# Patient Record
Sex: Female | Born: 1955
Health system: Southern US, Community
[De-identification: ages and names within clinical notes are randomized; demographics above are authoritative.]

## PROBLEM LIST (undated history)

## (undated) DIAGNOSIS — G43909 Migraine, unspecified, not intractable, without status migrainosus: Secondary | ICD-10-CM

## (undated) DIAGNOSIS — F419 Anxiety disorder, unspecified: Secondary | ICD-10-CM

## (undated) DIAGNOSIS — I10 Essential (primary) hypertension: Secondary | ICD-10-CM

## (undated) DIAGNOSIS — J449 Chronic obstructive pulmonary disease, unspecified: Secondary | ICD-10-CM

## (undated) DIAGNOSIS — F329 Major depressive disorder, single episode, unspecified: Secondary | ICD-10-CM

## (undated) DIAGNOSIS — F32A Depression, unspecified: Secondary | ICD-10-CM

## (undated) HISTORY — DX: Anxiety disorder, unspecified: F41.9

## (undated) HISTORY — DX: Depression, unspecified: F32.A

## (undated) HISTORY — DX: Essential (primary) hypertension: I10

## (undated) HISTORY — DX: Major depressive disorder, single episode, unspecified: F32.9

## (undated) HISTORY — DX: Chronic obstructive pulmonary disease, unspecified: J44.9

## (undated) HISTORY — DX: Migraine, unspecified, not intractable, without status migrainosus: G43.909

---

## 2002-04-01 ENCOUNTER — Emergency Department (HOSPITAL_COMMUNITY): Admission: EM | Admit: 2002-04-01 | Discharge: 2002-04-01 | Payer: Self-pay | Admitting: Emergency Medicine

## 2002-04-01 ENCOUNTER — Encounter: Payer: Self-pay | Admitting: Emergency Medicine

## 2017-06-01 ENCOUNTER — Ambulatory Visit (INDEPENDENT_AMBULATORY_CARE_PROVIDER_SITE_OTHER): Payer: BLUE CROSS/BLUE SHIELD

## 2017-06-01 ENCOUNTER — Encounter (INDEPENDENT_AMBULATORY_CARE_PROVIDER_SITE_OTHER): Payer: Self-pay | Admitting: Orthopaedic Surgery

## 2017-06-01 ENCOUNTER — Ambulatory Visit (INDEPENDENT_AMBULATORY_CARE_PROVIDER_SITE_OTHER): Payer: BLUE CROSS/BLUE SHIELD | Admitting: Orthopaedic Surgery

## 2017-06-01 VITALS — BP 134/68 | HR 72 | Ht 61.0 in | Wt 190.0 lb

## 2017-06-01 DIAGNOSIS — M25532 Pain in left wrist: Secondary | ICD-10-CM

## 2017-06-01 DIAGNOSIS — M25531 Pain in right wrist: Secondary | ICD-10-CM | POA: Diagnosis not present

## 2017-06-01 NOTE — Addendum Note (Signed)
Addended byPrescott Parma: Teya Otterson on: 06/01/2017 11:41 AM   Modules accepted: Orders

## 2017-06-01 NOTE — Progress Notes (Signed)
Office Visit Note   Patient: Michaela Stafford           Date of Birth: [birth year]           MRN: [id number] Visit Date: 06/01/2017              Requested by: No referring provider defined for this encounter. PCP: No primary care provider on file.   Assessment & Plan: Visit Diagnoses:  1. Bilateral wrist pain     Plan: Nerve conduction velocity she is taken anti-inflammatories used night splints with some improvement but persistent symptoms.  Office follow-up after electrical test to rule out carpal tunnel syndrome worse on the right than left.  Follow-Up Instructions: No Follow-up on file.   Orders:  Orders Placed This Encounter  Procedures  . XR Wrist 2 Views Left  . XR Wrist 2 Views Right   No orders of the defined types were placed in this encounter.     Procedures: No procedures performed   Clinical Data: No additional findings.   Subjective: Chief Complaint  Patient presents with  . Right Wrist - Pain  . Left Wrist - Pain    HPI 62 year old female with several year history of bilateral hand pain worse in the right non dominant hand than left.  She has been using some wrist splints at night that helps some.  She drops objects.  She shakes her arms and it wakes her up at night she also has some discomfort in her neck.  She was referred here for possible carpal tunnel syndrome right greater than left.  Patient had a reaction with prednisone in the past as well as penicillin.  Review of Systems three-point review of system positive for anxiety diabetes emphysema hypertension migraines and sleep apnea.  This is borderline.  She does not smoke or drink.   Objective: Vital Signs: BP 134/68   Pulse 72   Ht 5\' 1"  (1.549 m)   Wt 190 lb (86.2 kg)   BMI 35.90 kg/m   Physical Exam  Constitutional: She is oriented to person, place, and time. She appears well-developed.  HENT:  Head: Normocephalic.  Right Ear: External ear normal.  Left Ear: External ear normal.    Eyes: Pupils are equal, round, and reactive to light.  Neck: No tracheal deviation present. No thyromegaly present.  Cardiovascular: Normal rate.  Pulmonary/Chest: Effort normal.  Abdominal: Soft.  Neurological: She is alert and oriented to person, place, and time.  Skin: Skin is warm and dry.  Psychiatric: She has a normal mood and affect. Her behavior is normal.    Ortho Exam patient has mild thenar atrophy on the right negative on the left.  Pain with carpal compression test.  Negative Phalen's test.  Some tenderness of the left fifth finger PIP joint.  No triggering.  Elbows reach full extension bilateral brachial plexus tenderness negative Spurling no increased pain with compression no change with distraction. Specialty Comments:  No specialty comments available.  Imaging: Xr Wrist 2 Views Left  Result Date: 06/01/2017 The x-rays left wrist obtained and reviewed.  This shows normal wrist joint.  Negative for acute changes mild IP degenerative changes. Impression left wrist negative for acute changes.  Xr Wrist 2 Views Right  Result Date: 06/01/2017 Three-view x-rays right wrist obtained.  This shows some degenerative changes IP joint of the thumb.  Negative for acute changes no significant radiocarpal arthritis. Impression normal wrist mild degenerative changes in the thumb IP joint.    PMFS  History: There are no active problems to display for this patient.  History reviewed. No pertinent past medical history.  History reviewed. No pertinent family history.  History reviewed. No pertinent surgical history. Social History   Occupational History  . Not on file  Tobacco Use  . Smoking status: Not on file  Substance and Sexual Activity  . Alcohol use: Not on file  . Drug use: Not on file  . Sexual activity: Not on file

## 2017-06-02 ENCOUNTER — Telehealth (INDEPENDENT_AMBULATORY_CARE_PROVIDER_SITE_OTHER): Payer: Self-pay | Admitting: Physical Medicine and Rehabilitation

## 2017-06-02 NOTE — Telephone Encounter (Signed)
Patient was seen in [place] yesterday, and patients husband called checking up on [facility]. Please call when possible to schedule # [phone]

## 2017-06-16 ENCOUNTER — Ambulatory Visit (INDEPENDENT_AMBULATORY_CARE_PROVIDER_SITE_OTHER): Payer: BLUE CROSS/BLUE SHIELD | Admitting: Physical Medicine and Rehabilitation

## 2017-06-16 ENCOUNTER — Encounter (INDEPENDENT_AMBULATORY_CARE_PROVIDER_SITE_OTHER): Payer: Self-pay | Admitting: Physical Medicine and Rehabilitation

## 2017-06-16 DIAGNOSIS — R202 Paresthesia of skin: Secondary | ICD-10-CM | POA: Diagnosis not present

## 2017-06-16 NOTE — Progress Notes (Deleted)
Pt states pain, numbness, and tingling in both right and left hand. Pt states symptoms has been bothering her for the past 3 years and has gotten worse as time went on. Pt states doing anything and holding anything heavy makes it hurt worse, pt states "hand gloves" eases pain. Left hand dominant.

## 2017-06-20 ENCOUNTER — Encounter (INDEPENDENT_AMBULATORY_CARE_PROVIDER_SITE_OTHER): Payer: Self-pay | Admitting: Physical Medicine and Rehabilitation

## 2017-06-20 NOTE — Procedures (Signed)
EMG & NCV Findings: Evaluation of the right median motor nerve showed prolonged distal onset latency (4.5 ms) and decreased conduction velocity (Elbow-Wrist, 48 m/s).  The left median (across palm) sensory nerve showed prolonged distal peak latency (Wrist, 4.0 ms) and prolonged distal peak latency (Palm, 2.7 ms).  The right median (across palm) sensory nerve showed prolonged distal peak latency (Wrist, 6.6 ms), reduced amplitude (5.2 V), and prolonged distal peak latency (Palm, 4.8 ms).  All remaining nerves (as indicated in the following tables) were within normal limits.  All left vs. right side differences were within normal limits.    All examined muscles (as indicated in the following table) showed no evidence of electrical instability.    Impression: The above electrodiagnostic study is ABNORMAL and reveals evidence of:  1.  A moderate to severe right median nerve entrapment at the wrist (carpal tunnel syndrome) affecting sensory and motor components.   2.  A mild left median nerve entrapment at the wrist (carpal tunnel syndrome) affecting sensory components.   There is no significant electrodiagnostic evidence of any other focal nerve entrapment, brachial plexopathy or generalized peripheral neuropathy.    Recommendations: 1.  Continue current management of symptoms. 2.  Continue use of resting splint at night-time and as needed during the day. 3.  Suggest surgical evaluation. 4.  Follow-up with referring physician.   Nerve Conduction Studies Anti Sensory Summary Table   Stim Site NR Peak (ms) Norm Peak (ms) P-T Amp (V) Norm P-T Amp Site1 Site2 Delta-P (ms) Dist (cm) Vel (m/s) Norm Vel (m/s)  Left Median Acr Palm Anti Sensory (2nd Digit)  32.8C  Wrist    *4.0 <3.6 15.7 >10 Wrist Palm 1.3 0.0    Palm    *2.7 <2.0 4.6         Right Median Acr Palm Anti Sensory (2nd Digit)  33.1C  Wrist    *6.6 <3.6 *5.2 >10 Wrist Palm 1.8 0.0    Palm    *4.8 <2.0 12.3         Left Radial Anti  Sensory (Base 1st Digit)  32.6C  Wrist    2.2 <3.1 18.7  Wrist Base 1st Digit 2.2 0.0    Right Radial Anti Sensory (Base 1st Digit)  33.2C  Wrist    2.1 <3.1 19.7  Wrist Base 1st Digit 2.1 0.0    Left Ulnar Anti Sensory (5th Digit)  32.8C  Wrist    3.2 <3.7 18.2 >15.0 Wrist 5th Digit 3.2 14.0 44 >38  Right Ulnar Anti Sensory (5th Digit)  33.3C  Wrist    3.0 <3.7 19.2 >15.0 Wrist 5th Digit 3.0 14.0 47 >38   Motor Summary Table   Stim Site NR Onset (ms) Norm Onset (ms) O-P Amp (mV) Norm O-P Amp Site1 Site2 Delta-0 (ms) Dist (cm) Vel (m/s) Norm Vel (m/s)  Left Median Motor (Abd Poll Brev)  32.7C  Wrist    3.9 <4.2 7.1 >5 Elbow Wrist 3.7 18.5 50 >50  Elbow    7.6  7.4         Right Median Motor (Abd Poll Brev)  33.1C  Wrist    *4.5 <4.2 8.6 >5 Elbow Wrist 3.6 17.2 *48 >50  Elbow    8.1  8.3         Left Ulnar Motor (Abd Dig Min)  32.5C  Wrist    2.5 <4.2 6.8 >3 B Elbow Wrist 2.8 17.0 61 >53  B Elbow    5.3  6.8  A Elbow B Elbow 1.2 10.0 83 >53  A Elbow    6.5  6.7         Right Ulnar Motor (Abd Dig Min)  33.1C  Wrist    2.7 <4.2 7.2 >3 B Elbow Wrist 2.6 16.5 63 >53  B Elbow    5.3  7.8  A Elbow B Elbow 1.0 9.0 90 >53  A Elbow    6.3  7.6          EMG   Side Muscle Nerve Root Ins Act Fibs Psw Amp Dur Poly Recrt Int Dennie Bible Comment  Right Abd Poll Brev Median C8-T1 Nml Nml Nml Nml Nml 0 Nml Nml   Right 1stDorInt Ulnar C8-T1 Nml Nml Nml Nml Nml 0 Nml Nml     Nerve Conduction Studies Anti Sensory Left/Right Comparison   Stim Site L Lat (ms) R Lat (ms) L-R Lat (ms) L Amp (V) R Amp (V) L-R Amp (%) Site1 Site2 L Vel (m/s) R Vel (m/s) L-R Vel (m/s)  Median Acr Palm Anti Sensory (2nd Digit)  32.8C  Wrist *4.0 *6.6 2.6 15.7 *5.2 66.9 Wrist Palm     Palm *2.7 *4.8 2.1 4.6 12.3 62.6       Radial Anti Sensory (Base 1st Digit)  32.6C  Wrist 2.2 2.1 0.1 18.7 19.7 5.1 Wrist Base 1st Digit     Ulnar Anti Sensory (5th Digit)  32.8C  Wrist 3.2 3.0 0.2 18.2 19.2 5.2 Wrist 5th Digit 44 47  3   Motor Left/Right Comparison   Stim Site L Lat (ms) R Lat (ms) L-R Lat (ms) L Amp (mV) R Amp (mV) L-R Amp (%) Site1 Site2 L Vel (m/s) R Vel (m/s) L-R Vel (m/s)  Median Motor (Abd Poll Brev)  32.7C  Wrist 3.9 *4.5 0.6 7.1 8.6 17.4 Elbow Wrist 50 *48 2  Elbow 7.6 8.1 0.5 7.4 8.3 10.8       Ulnar Motor (Abd Dig Min)  32.5C  Wrist 2.5 2.7 0.2 6.8 7.2 5.6 B Elbow Wrist 61 63 2  B Elbow 5.3 5.3 0.0 6.8 7.8 12.8 A Elbow B Elbow 83 90 7  A Elbow 6.5 6.3 0.2 6.7 7.6 11.8          Waveforms:

## 2017-06-20 NOTE — Progress Notes (Signed)
Michaela Stafford - 62 y.o. female MRN [id number]  Date of birth: 06/19/1955  Office Visit Note: Visit Date: 06/16/2017 PCP: System, Pcp Not In Referred by: No ref. provider found  Subjective: Chief Complaint  Patient presents with  . Right Hand - Pain, Numbness, Tingling  . Left Hand - Pain, Numbness, Tingling   HPI: Michaela Stafford is a 62 year old left-hand-dominant female with pain numbness and tingling in both the right and left hands right more than left.  She reports symptoms over the last 3 years but really worsening as time has progressed.  She reports that doing anything and holding anything heavy will make the pain in the wrist and hands hurt worse and that she will get more numbness.  She does report that using "hand gloves "eases the pain.  She also reports some nocturnal complaints of increasing hand numbness.  She denies any frank radicular symptoms.  She has not had prior electrodiagnostic studies.  She has been using anti-inflammatories.    ROS Otherwise per HPI.  Assessment & Plan: Visit Diagnoses:  1. Paresthesia of skin     Plan: No additional findings.   Meds & Orders: No orders of the defined types were placed in this encounter.   Orders Placed This Encounter  Procedures  . NCV with EMG (electromyography)    Follow-up: Return for Dr. Ophelia Charter.  Impression: The above electrodiagnostic study is ABNORMAL and reveals evidence of:  1.  A moderate to severe right median nerve entrapment at the wrist (carpal tunnel syndrome) affecting sensory and motor components.   2.  A mild left median nerve entrapment at the wrist (carpal tunnel syndrome) affecting sensory components.   There is no significant electrodiagnostic evidence of any other focal nerve entrapment, brachial plexopathy or generalized peripheral neuropathy.    Recommendations: 1.  Continue current management of symptoms. 2.  Continue use of resting splint at night-time and as needed during the day. 3.  Suggest  surgical evaluation. 4.  Follow-up with referring physician.    Procedures: No procedures performed  EMG & NCV Findings: Evaluation of the right median motor nerve showed prolonged distal onset latency (4.5 ms) and decreased conduction velocity (Elbow-Wrist, 48 m/s).  The left median (across palm) sensory nerve showed prolonged distal peak latency (Wrist, 4.0 ms) and prolonged distal peak latency (Palm, 2.7 ms).  The right median (across palm) sensory nerve showed prolonged distal peak latency (Wrist, 6.6 ms), reduced amplitude (5.2 V), and prolonged distal peak latency (Palm, 4.8 ms).  All remaining nerves (as indicated in the following tables) were within normal limits.  All left vs. right side differences were within normal limits.    All examined muscles (as indicated in the following table) showed no evidence of electrical instability.    Impression: The above electrodiagnostic study is ABNORMAL and reveals evidence of:  1.  A moderate to severe right median nerve entrapment at the wrist (carpal tunnel syndrome) affecting sensory and motor components.   2.  A mild left median nerve entrapment at the wrist (carpal tunnel syndrome) affecting sensory components.   There is no significant electrodiagnostic evidence of any other focal nerve entrapment, brachial plexopathy or generalized peripheral neuropathy.    Recommendations: 1.  Continue current management of symptoms. 2.  Continue use of resting splint at night-time and as needed during the day. 3.  Suggest surgical evaluation. 4.  Follow-up with referring physician.   Nerve Conduction Studies Anti Sensory Summary Table   Stim Site NR Peak (ms)  Norm Peak (ms) P-T Amp (V) Norm P-T Amp Site1 Site2 Delta-P (ms) Dist (cm) Vel (m/s) Norm Vel (m/s)  Left Median Acr Palm Anti Sensory (2nd Digit)  32.8C  Wrist    *4.0 <3.6 15.7 >10 Wrist Palm 1.3 0.0    Palm    *2.7 <2.0 4.6         Right Median Acr Palm Anti Sensory (2nd Digit)   33.1C  Wrist    *6.6 <3.6 *5.2 >10 Wrist Palm 1.8 0.0    Palm    *4.8 <2.0 12.3         Left Radial Anti Sensory (Base 1st Digit)  32.6C  Wrist    2.2 <3.1 18.7  Wrist Base 1st Digit 2.2 0.0    Right Radial Anti Sensory (Base 1st Digit)  33.2C  Wrist    2.1 <3.1 19.7  Wrist Base 1st Digit 2.1 0.0    Left Ulnar Anti Sensory (5th Digit)  32.8C  Wrist    3.2 <3.7 18.2 >15.0 Wrist 5th Digit 3.2 14.0 44 >38  Right Ulnar Anti Sensory (5th Digit)  33.3C  Wrist    3.0 <3.7 19.2 >15.0 Wrist 5th Digit 3.0 14.0 47 >38   Motor Summary Table   Stim Site NR Onset (ms) Norm Onset (ms) O-P Amp (mV) Norm O-P Amp Site1 Site2 Delta-0 (ms) Dist (cm) Vel (m/s) Norm Vel (m/s)  Left Median Motor (Abd Poll Brev)  32.7C  Wrist    3.9 <4.2 7.1 >5 Elbow Wrist 3.7 18.5 50 >50  Elbow    7.6  7.4         Right Median Motor (Abd Poll Brev)  33.1C  Wrist    *4.5 <4.2 8.6 >5 Elbow Wrist 3.6 17.2 *48 >50  Elbow    8.1  8.3         Left Ulnar Motor (Abd Dig Min)  32.5C  Wrist    2.5 <4.2 6.8 >3 B Elbow Wrist 2.8 17.0 61 >53  B Elbow    5.3  6.8  A Elbow B Elbow 1.2 10.0 83 >53  A Elbow    6.5  6.7         Right Ulnar Motor (Abd Dig Min)  33.1C  Wrist    2.7 <4.2 7.2 >3 B Elbow Wrist 2.6 16.5 63 >53  B Elbow    5.3  7.8  A Elbow B Elbow 1.0 9.0 90 >53  A Elbow    6.3  7.6          EMG   Side Muscle Nerve Root Ins Act Fibs Psw Amp Dur Poly Recrt Int Dennie BiblePat Comment  Right Abd Poll Brev Median C8-T1 Nml Nml Nml Nml Nml 0 Nml Nml   Right 1stDorInt Ulnar C8-T1 Nml Nml Nml Nml Nml 0 Nml Nml     Nerve Conduction Studies Anti Sensory Left/Right Comparison   Stim Site L Lat (ms) R Lat (ms) L-R Lat (ms) L Amp (V) R Amp (V) L-R Amp (%) Site1 Site2 L Vel (m/s) R Vel (m/s) L-R Vel (m/s)  Median Acr Palm Anti Sensory (2nd Digit)  32.8C  Wrist *4.0 *6.6 2.6 15.7 *5.2 66.9 Wrist Palm     Palm *2.7 *4.8 2.1 4.6 12.3 62.6       Radial Anti Sensory (Base 1st Digit)  32.6C  Wrist 2.2 2.1 0.1 18.7 19.7 5.1 Wrist Base  1st Digit     Ulnar Anti Sensory (5th Digit)  32.8C  Wrist 3.2  3.0 0.2 18.2 19.2 5.2 Wrist 5th Digit 44 47 3   Motor Left/Right Comparison   Stim Site L Lat (ms) R Lat (ms) L-R Lat (ms) L Amp (mV) R Amp (mV) L-R Amp (%) Site1 Site2 L Vel (m/s) R Vel (m/s) L-R Vel (m/s)  Median Motor (Abd Poll Brev)  32.7C  Wrist 3.9 *4.5 0.6 7.1 8.6 17.4 Elbow Wrist 50 *48 2  Elbow 7.6 8.1 0.5 7.4 8.3 10.8       Ulnar Motor (Abd Dig Min)  32.5C  Wrist 2.5 2.7 0.2 6.8 7.2 5.6 B Elbow Wrist 61 63 2  B Elbow 5.3 5.3 0.0 6.8 7.8 12.8 A Elbow B Elbow 83 90 7  A Elbow 6.5 6.3 0.2 6.7 7.6 11.8          Waveforms:                     Clinical History: No specialty comments available.  She reports that she has quit smoking. she has never used smokeless tobacco. No results for input(s): HGBA1C, LABURIC in the last 8760 hours.  Objective:  VS:  HT:    WT:   BMI:     BP:   HR: bpm  TEMP: ( )  RESP:  Physical Exam  Musculoskeletal:  Inspection reveals some flattening of the right APB but no atrophy of the bilateral APB or FDI or hand intrinsics. There is no swelling, color changes, allodynia or dystrophic changes. There is 5 out of 5 strength in the bilateral wrist extension, finger abduction and long finger flexion.  There is decreased sensation to light touch in the right median nerve distribution compared to left.   There is a negative Hoffmann's test bilaterally.    Ortho Exam Imaging: No results found.  Past Medical/Family/Surgical/Social History: Medications & Allergies reviewed per EMR There are no active problems to display for this patient.  Past Medical History:  Diagnosis Date  . Anxiety   . COPD (chronic obstructive pulmonary disease) (HCC)   . Depression   . Hypertension   . Migraine    History reviewed. No pertinent family history. History reviewed. No pertinent surgical history. Social History   Occupational History  . Not on file  Tobacco Use  . Smoking status:  Former Games developer  . Smokeless tobacco: Never Used  Substance and Sexual Activity  . Alcohol use: Not on file  . Drug use: Not on file  . Sexual activity: Not on file

## 2017-06-22 ENCOUNTER — Encounter (INDEPENDENT_AMBULATORY_CARE_PROVIDER_SITE_OTHER): Payer: Self-pay | Admitting: Orthopaedic Surgery

## 2017-06-22 ENCOUNTER — Ambulatory Visit (INDEPENDENT_AMBULATORY_CARE_PROVIDER_SITE_OTHER): Payer: BLUE CROSS/BLUE SHIELD | Admitting: Orthopaedic Surgery

## 2017-06-22 VITALS — BP 129/75 | HR 81 | Ht 61.5 in | Wt 180.0 lb

## 2017-06-22 DIAGNOSIS — G5603 Carpal tunnel syndrome, bilateral upper limbs: Secondary | ICD-10-CM

## 2017-06-22 NOTE — Progress Notes (Signed)
Office Visit Note   Patient: Michaela Stafford           Date of Birth: Oct 03, 1955           MRN: [id number] Visit Date: 06/22/2017              Requested by: No referring provider defined for this encounter. PCP: System, Pcp Not In   Assessment & Plan: Visit Diagnoses:  1. Bilateral carpal tunnel syndrome     Plan: Patient has moderate to severe carpal tunnel syndrome on the right , mild on the  left.  He would like to proceed with right carpal tunnel release as an outpatient.  Procedure discussed technique discussed.  Questions were elicited and answered she would like to proceed. Follow-Up Instructions: Preop for right carpal tunnel release outpatient surgery with IV sedation MAC  and local anesthesia.  Orders:  No orders of the defined types were placed in this encounter.  No orders of the defined types were placed in this encounter.     Procedures: No procedures performed   Clinical Data: No additional findings.   Subjective: Chief Complaint  Patient presents with  . Right Wrist - Pain, Follow-up    EMG/NCS review  . Left Wrist - Pain, Follow-up    EMG/NCS review    HPI patient returns for bilateral carpal tunnel syndrome is been using a splint.  Had symptoms for greater than 3 years more problems with the right hand and left hand.  Day drops objects.  She is taken anti-inflammatories without relief.  Right carpal tunnel was moderate to severe by nerve conduction velocities and EMGs left was rated at mild.  She has bilateral hand symptoms but much worse symptoms on the right than left.  Review of Systems 14 point review of systems updated and positive for anxiety, diabetes, emphysema, hypertension migraines, sleep apnea.  Sleep apnea was rated at borderline.  Patient does not smoke and does not drink.   Objective: Vital Signs: BP 129/75   Pulse 81   Ht 5' 1.5" (1.562 m)   Wt 180 lb (81.6 kg)   BMI 33.46 kg/m   Physical Exam  Constitutional: She is oriented to  person, place, and time. She appears well-developed.  HENT:  Head: Normocephalic.  Right Ear: External ear normal.  Left Ear: External ear normal.  Eyes: Pupils are equal, round, and reactive to light.  Neck: No tracheal deviation present. No thyromegaly present.  Cardiovascular: Normal rate.  Pulmonary/Chest: Effort normal.  Abdominal: Soft.  Neurological: She is alert and oriented to person, place, and time.  Skin: Skin is warm and dry.  Psychiatric: She has a normal mood and affect. Her behavior is normal.    Ortho Exam Tinel's right greater than left at the carpal canal.  Mild thenar atrophy on the right normal on the left.  Her weakness on the right.  No brachial plexus tenderness.  Negative Spurling right and left.  Lower extremity reflexes are 2+.  Station to the ulnar digits are normal.  Cubital tunnel is normal right and left.  Specialty Comments:  No specialty comments available.  Imaging: No results found.   PMFS History: There are no active problems to display for this patient.  Past Medical History:  Diagnosis Date  . Anxiety   . COPD (chronic obstructive pulmonary disease) ([place])   . Depression   . Hypertension   . Migraine     No family history on file.  No past surgical history on  file. Social History   Occupational History  . Not on file  Tobacco Use  . Smoking status: Former Research scientist (life sciences)  . Smokeless tobacco: Never Used  Substance and Sexual Activity  . Alcohol use: Not on file  . Drug use: Not on file  . Sexual activity: Not on file

## 2017-07-03 DIAGNOSIS — G5601 Carpal tunnel syndrome, right upper limb: Secondary | ICD-10-CM | POA: Diagnosis not present

## 2017-07-12 ENCOUNTER — Encounter (INDEPENDENT_AMBULATORY_CARE_PROVIDER_SITE_OTHER): Payer: Self-pay | Admitting: Surgery

## 2017-07-12 ENCOUNTER — Ambulatory Visit (INDEPENDENT_AMBULATORY_CARE_PROVIDER_SITE_OTHER): Payer: BLUE CROSS/BLUE SHIELD | Admitting: Surgery

## 2017-07-12 VITALS — Ht 61.5 in | Wt 180.0 lb

## 2017-07-12 DIAGNOSIS — Z9889 Other specified postprocedural states: Secondary | ICD-10-CM

## 2017-07-12 NOTE — Progress Notes (Signed)
   Office Visit Note   Patient: Michaela Stafford           Date of Birth: 1956-02-08           MRN: [id number] Visit Date: 07/12/2017              Requested by: No referring provider defined for this encounter. PCP: System, Pcp Not In   Assessment & Plan: Visit Diagnoses:  1. Status post carpal tunnel release     Plan: Patient will follow up with Dr. Ophelia CharterYates as scheduled next week for wound check and possible suture removal in [place].  She is given a removable wrist splint to wear.  Can work gentle range of motion of her wrist and fingers.  No lifting pushing pulling with right hand.  Follow-Up Instructions: Return for Next week with Dr. Ophelia CharterYates in [place] as scheduled.   Orders:  No orders of the defined types were placed in this encounter.  No orders of the defined types were placed in this encounter.     Procedures: No procedures performed   Clinical Data: No additional findings.   Subjective: Chief Complaint  Patient presents with  . Right Wrist - Routine Post Op    HPI Patient a one-week status post right carpal tunnel release returns.  States that she is doing well.  Not having much pain.  Weaning off pain medication. Review of Systems No cardiac pulmonary GI GU issues  Objective: Vital Signs: Ht 5' 1.5" (1.562 m)   Wt 180 lb (81.6 kg)   BMI 33.46 kg/m   Physical Exam  Constitutional: She is oriented to person, place, and time. She appears well-developed. No distress.  HENT:  Head: Normocephalic.  Eyes: EOM are normal. Pupils are equal, round, and reactive to light.  Pulmonary/Chest: No respiratory distress.  Musculoskeletal:  Wound looks good.  Sutures are intact.  No drainage or signs of infection.  No much by the way of swelling.  Neurological: She is oriented to person, place, and time.    Ortho Exam  Specialty Comments:  No specialty comments available.  Imaging: No results found.   PMFS History: There are no active problems to display for this  patient.  Past Medical History:  Diagnosis Date  . Anxiety   . COPD (chronic obstructive pulmonary disease) (HCC)   . Depression   . Hypertension   . Migraine     No family history on file.  No past surgical history on file. Social History   Occupational History  . Not on file  Tobacco Use  . Smoking status: Former Games developermoker  . Smokeless tobacco: Never Used  Substance and Sexual Activity  . Alcohol use: Not on file  . Drug use: Not on file  . Sexual activity: Not on file

## 2017-07-20 ENCOUNTER — Ambulatory Visit (INDEPENDENT_AMBULATORY_CARE_PROVIDER_SITE_OTHER): Payer: BLUE CROSS/BLUE SHIELD | Admitting: Orthopaedic Surgery

## 2017-07-20 ENCOUNTER — Encounter (INDEPENDENT_AMBULATORY_CARE_PROVIDER_SITE_OTHER): Payer: Self-pay | Admitting: Orthopaedic Surgery

## 2017-07-20 VITALS — BP 134/72 | HR 66

## 2017-07-20 DIAGNOSIS — G5602 Carpal tunnel syndrome, left upper limb: Secondary | ICD-10-CM

## 2017-07-20 NOTE — Progress Notes (Signed)
   Post-Op Visit Note   Patient: Michaela Stafford           Date of Birth: [birth year]           MRN: [id number] Visit Date: 07/20/2017 PCP: System, Pcp Not In   Assessment & Plan: Post right carpal tunnel release sutures are harvested incision looks good opposite hand gives her some problems she will call if she becomes more symptomatic.  She is happy with the surgical result.  Chief Complaint:  Chief Complaint  Patient presents with  . Right Wrist - Routine Post Op   Visit Diagnoses:  1. Carpal tunnel syndrome, left upper limb     Plan: Sutures out return PRN.  Follow-Up Instructions: Return if symptoms worsen or fail to improve.   Orders:  No orders of the defined types were placed in this encounter.  No orders of the defined types were placed in this encounter.   Imaging: No results found.  PMFS History: There are no active problems to display for this patient.  Past Medical History:  Diagnosis Date  . Anxiety   . COPD (chronic obstructive pulmonary disease) (HCC)   . Depression   . Hypertension   . Migraine     No family history on file.  No past surgical history on file. Social History   Occupational History  . Not on file  Tobacco Use  . Smoking status: Former Games developermoker  . Smokeless tobacco: Never Used  Substance and Sexual Activity  . Alcohol use: Not on file  . Drug use: Not on file  . Sexual activity: Not on file

## 2017-07-21 ENCOUNTER — Ambulatory Visit (INDEPENDENT_AMBULATORY_CARE_PROVIDER_SITE_OTHER): Payer: BLUE CROSS/BLUE SHIELD | Admitting: Orthopaedic Surgery

## 2017-08-10 ENCOUNTER — Ambulatory Visit (INDEPENDENT_AMBULATORY_CARE_PROVIDER_SITE_OTHER): Payer: BLUE CROSS/BLUE SHIELD | Admitting: Orthopaedic Surgery

## 2017-08-10 ENCOUNTER — Encounter (INDEPENDENT_AMBULATORY_CARE_PROVIDER_SITE_OTHER): Payer: Self-pay | Admitting: Orthopaedic Surgery

## 2017-08-10 VITALS — BP 127/76 | HR 71

## 2017-08-10 DIAGNOSIS — M79641 Pain in right hand: Secondary | ICD-10-CM

## 2017-08-10 NOTE — Progress Notes (Signed)
   Post-Op Visit Note   Patient: Michaela Stafford           Date of Birth: 1955/12/08           MRN: [id number] Visit Date: 08/10/2017 PCP: System, Pcp Not In   Assessment & Plan: Post right carpal tunnel release she has severe carpal tunnel syndrome.  She has some itching at the incision some thickening in the subcutaneous tissue.  She is using some Benadryl trick cream and uses her splint at night.  Opposite hand had less severe disease.  WILL TRY some gabapentin 100 mg at night.  Chief Complaint:  Chief Complaint  Patient presents with  . Right Wrist - Routine Post Op   Visit Diagnoses:  1. Pain in right hand     Plan: Carpal tunnel release with itching at the incision and tenderness.  We will try some Neurontin at night her carpal tunnel was severe.  Recheck 5 weeks.  Opposite hand showed moderate disease.  Follow-Up Instructions: Return in about 5 weeks (around 09/14/2017).   Orders:  No orders of the defined types were placed in this encounter.  No orders of the defined types were placed in this encounter.   Imaging: No results found.  PMFS History: There are no active problems to display for this patient.  Past Medical History:  Diagnosis Date  . Anxiety   . COPD (chronic obstructive pulmonary disease) (HCC)   . Depression   . Hypertension   . Migraine     No family history on file.  No past surgical history on file. Social History   Occupational History  . Not on file  Tobacco Use  . Smoking status: Former Games developermoker  . Smokeless tobacco: Never Used  Substance and Sexual Activity  . Alcohol use: Not on file  . Drug use: Not on file  . Sexual activity: Not on file

## 2017-09-05 ENCOUNTER — Telehealth (INDEPENDENT_AMBULATORY_CARE_PROVIDER_SITE_OTHER): Payer: Self-pay | Admitting: Radiology

## 2017-09-05 MED ORDER — GABAPENTIN 100 MG PO CAPS: 100.0000 mg | ORAL_CAPSULE | Freq: Every day | ORAL | 1 refills | Status: AC

## 2017-09-05 NOTE — Telephone Encounter (Signed)
Sent to pharmacy 

## 2017-09-05 NOTE — Telephone Encounter (Signed)
Ok for #  30 tablets and one refill. Not sure she will need it for 3 months thanks

## 2017-09-05 NOTE — Telephone Encounter (Signed)
Received request for refill Gabapentin 100mg  capsule- Take one capsule by mouth at bedtime #90 from [facility] in [place].  OK to refill?

## 2017-09-14 ENCOUNTER — Ambulatory Visit (INDEPENDENT_AMBULATORY_CARE_PROVIDER_SITE_OTHER): Payer: BLUE CROSS/BLUE SHIELD | Admitting: Orthopaedic Surgery

## 2017-09-21 ENCOUNTER — Ambulatory Visit (INDEPENDENT_AMBULATORY_CARE_PROVIDER_SITE_OTHER): Payer: BLUE CROSS/BLUE SHIELD | Admitting: Orthopaedic Surgery

## 2018-10-02 DIAGNOSIS — I10 Essential (primary) hypertension: Secondary | ICD-10-CM | POA: Diagnosis not present

## 2018-10-02 DIAGNOSIS — E7800 Pure hypercholesterolemia, unspecified: Secondary | ICD-10-CM | POA: Diagnosis not present

## 2018-10-02 DIAGNOSIS — E782 Mixed hyperlipidemia: Secondary | ICD-10-CM | POA: Diagnosis not present

## 2018-10-02 DIAGNOSIS — E119 Type 2 diabetes mellitus without complications: Secondary | ICD-10-CM | POA: Diagnosis not present

## 2018-10-02 DIAGNOSIS — J441 Chronic obstructive pulmonary disease with (acute) exacerbation: Secondary | ICD-10-CM | POA: Diagnosis not present

## 2018-10-10 DIAGNOSIS — J438 Other emphysema: Secondary | ICD-10-CM | POA: Diagnosis not present

## 2018-10-10 DIAGNOSIS — E6609 Other obesity due to excess calories: Secondary | ICD-10-CM | POA: Diagnosis not present

## 2018-10-10 DIAGNOSIS — G4733 Obstructive sleep apnea (adult) (pediatric): Secondary | ICD-10-CM | POA: Diagnosis not present

## 2018-10-10 DIAGNOSIS — R69 Illness, unspecified: Secondary | ICD-10-CM | POA: Diagnosis not present

## 2018-10-10 DIAGNOSIS — E782 Mixed hyperlipidemia: Secondary | ICD-10-CM | POA: Diagnosis not present

## 2018-10-10 DIAGNOSIS — E119 Type 2 diabetes mellitus without complications: Secondary | ICD-10-CM | POA: Diagnosis not present

## 2018-10-10 DIAGNOSIS — Z6834 Body mass index (BMI) 34.0-34.9, adult: Secondary | ICD-10-CM | POA: Diagnosis not present

## 2018-10-10 DIAGNOSIS — I10 Essential (primary) hypertension: Secondary | ICD-10-CM | POA: Diagnosis not present

## 2019-01-17 DIAGNOSIS — H5203 Hypermetropia, bilateral: Secondary | ICD-10-CM | POA: Diagnosis not present

## 2019-01-17 DIAGNOSIS — Z0100 Encounter for examination of eyes and vision without abnormal findings: Secondary | ICD-10-CM | POA: Diagnosis not present

## 2019-02-05 DIAGNOSIS — G4733 Obstructive sleep apnea (adult) (pediatric): Secondary | ICD-10-CM | POA: Diagnosis not present

## 2019-02-05 DIAGNOSIS — E6609 Other obesity due to excess calories: Secondary | ICD-10-CM | POA: Diagnosis not present

## 2019-02-05 DIAGNOSIS — E119 Type 2 diabetes mellitus without complications: Secondary | ICD-10-CM | POA: Diagnosis not present

## 2019-02-05 DIAGNOSIS — Z6835 Body mass index (BMI) 35.0-35.9, adult: Secondary | ICD-10-CM | POA: Diagnosis not present

## 2019-02-05 DIAGNOSIS — R69 Illness, unspecified: Secondary | ICD-10-CM | POA: Diagnosis not present

## 2019-02-05 DIAGNOSIS — I10 Essential (primary) hypertension: Secondary | ICD-10-CM | POA: Diagnosis not present

## 2019-02-05 DIAGNOSIS — E782 Mixed hyperlipidemia: Secondary | ICD-10-CM | POA: Diagnosis not present

## 2019-02-05 DIAGNOSIS — J438 Other emphysema: Secondary | ICD-10-CM | POA: Diagnosis not present

## 2019-02-11 DIAGNOSIS — Z23 Encounter for immunization: Secondary | ICD-10-CM | POA: Diagnosis not present

## 2019-02-27 DIAGNOSIS — I10 Essential (primary) hypertension: Secondary | ICD-10-CM | POA: Diagnosis not present

## 2019-02-27 DIAGNOSIS — E119 Type 2 diabetes mellitus without complications: Secondary | ICD-10-CM | POA: Diagnosis not present

## 2019-02-27 DIAGNOSIS — E782 Mixed hyperlipidemia: Secondary | ICD-10-CM | POA: Diagnosis not present

## 2019-03-29 DIAGNOSIS — E782 Mixed hyperlipidemia: Secondary | ICD-10-CM | POA: Diagnosis not present

## 2019-03-29 DIAGNOSIS — I10 Essential (primary) hypertension: Secondary | ICD-10-CM | POA: Diagnosis not present

## 2019-04-29 DIAGNOSIS — I10 Essential (primary) hypertension: Secondary | ICD-10-CM | POA: Diagnosis not present

## 2019-04-29 DIAGNOSIS — E782 Mixed hyperlipidemia: Secondary | ICD-10-CM | POA: Diagnosis not present

## 2019-04-29 DIAGNOSIS — E119 Type 2 diabetes mellitus without complications: Secondary | ICD-10-CM | POA: Diagnosis not present

## 2019-05-28 DIAGNOSIS — E782 Mixed hyperlipidemia: Secondary | ICD-10-CM | POA: Diagnosis not present

## 2019-05-28 DIAGNOSIS — E1142 Type 2 diabetes mellitus with diabetic polyneuropathy: Secondary | ICD-10-CM | POA: Diagnosis not present

## 2019-05-28 DIAGNOSIS — E876 Hypokalemia: Secondary | ICD-10-CM | POA: Diagnosis not present

## 2019-05-30 DIAGNOSIS — E782 Mixed hyperlipidemia: Secondary | ICD-10-CM | POA: Diagnosis not present

## 2019-05-30 DIAGNOSIS — I10 Essential (primary) hypertension: Secondary | ICD-10-CM | POA: Diagnosis not present

## 2019-06-03 DIAGNOSIS — M545 Low back pain: Secondary | ICD-10-CM | POA: Diagnosis not present

## 2019-06-03 DIAGNOSIS — Z6835 Body mass index (BMI) 35.0-35.9, adult: Secondary | ICD-10-CM | POA: Diagnosis not present

## 2019-06-06 DIAGNOSIS — Z23 Encounter for immunization: Secondary | ICD-10-CM | POA: Diagnosis not present

## 2019-06-06 DIAGNOSIS — E119 Type 2 diabetes mellitus without complications: Secondary | ICD-10-CM | POA: Diagnosis not present

## 2019-06-06 DIAGNOSIS — G4733 Obstructive sleep apnea (adult) (pediatric): Secondary | ICD-10-CM | POA: Diagnosis not present

## 2019-06-06 DIAGNOSIS — F411 Generalized anxiety disorder: Secondary | ICD-10-CM | POA: Diagnosis not present

## 2019-06-06 DIAGNOSIS — E782 Mixed hyperlipidemia: Secondary | ICD-10-CM | POA: Diagnosis not present

## 2019-06-06 DIAGNOSIS — I10 Essential (primary) hypertension: Secondary | ICD-10-CM | POA: Diagnosis not present

## 2019-06-06 DIAGNOSIS — Z6835 Body mass index (BMI) 35.0-35.9, adult: Secondary | ICD-10-CM | POA: Diagnosis not present

## 2019-06-06 DIAGNOSIS — J438 Other emphysema: Secondary | ICD-10-CM | POA: Diagnosis not present

## 2019-07-26 DIAGNOSIS — E7849 Other hyperlipidemia: Secondary | ICD-10-CM | POA: Diagnosis not present

## 2019-07-26 DIAGNOSIS — I10 Essential (primary) hypertension: Secondary | ICD-10-CM | POA: Diagnosis not present

## 2019-08-01 ENCOUNTER — Ambulatory Visit: Payer: Self-pay | Attending: Internal Medicine

## 2019-08-01 DIAGNOSIS — Z23 Encounter for immunization: Secondary | ICD-10-CM | POA: Insufficient documentation

## 2019-08-01 NOTE — Progress Notes (Signed)
   Covid-19 Vaccination Clinic  Name:  Michaela Stafford    MRN: [id number] DOB: July 15, 1955  08/01/2019  Ms. Michaela Stafford was observed post Covid-19 immunization for 15 minutes without incident. She was provided with Vaccine Information Sheet and instruction to access the V-Safe system.   Ms. Michaela Stafford was instructed to call 911 with any severe reactions post vaccine: Marland Kitchen Difficulty breathing  . Swelling of face and throat  . A fast heartbeat  . A bad rash all over body  . Dizziness and weakness   Immunizations Administered    Name Date Dose VIS Date Route   Moderna COVID-19 Vaccine 08/01/2019 10:50 AM 0.5 mL 04/30/2019 Intramuscular   Manufacturer: Moderna   Lot: [id number]   NDC: 33825-053-97

## 2019-08-28 DIAGNOSIS — I10 Essential (primary) hypertension: Secondary | ICD-10-CM | POA: Diagnosis not present

## 2019-08-28 DIAGNOSIS — E7849 Other hyperlipidemia: Secondary | ICD-10-CM | POA: Diagnosis not present

## 2019-09-03 ENCOUNTER — Ambulatory Visit: Payer: Self-pay | Attending: Internal Medicine

## 2019-09-03 DIAGNOSIS — Z23 Encounter for immunization: Secondary | ICD-10-CM

## 2019-09-03 NOTE — Progress Notes (Signed)
   Covid-19 Vaccination Clinic  Name:  Michaela Stafford    MRN: [id number] DOB: [birth year]  09/03/2019  [name] was observed post Covid-19 immunization for 15 minutes without incident. She was provided with Vaccine Information Sheet and instruction to access the V-Safe system.   [name] was instructed to call 911 with any severe reactions post vaccine: Marland Kitchen Difficulty breathing  . Swelling of face and throat  . A fast heartbeat  . A bad rash all over body  . Dizziness and weakness   Immunizations Administered    Name Date Dose VIS Date Route   Moderna COVID-19 Vaccine 09/03/2019  8:57 AM 0.5 mL 04/30/2019 Intramuscular   Manufacturer: Moderna   Lot: [id number]   NDC: 07218-288-33

## 2019-09-27 DIAGNOSIS — I10 Essential (primary) hypertension: Secondary | ICD-10-CM | POA: Diagnosis not present

## 2019-09-27 DIAGNOSIS — E7849 Other hyperlipidemia: Secondary | ICD-10-CM | POA: Diagnosis not present

## 2019-09-30 DIAGNOSIS — R5383 Other fatigue: Secondary | ICD-10-CM | POA: Diagnosis not present

## 2019-09-30 DIAGNOSIS — I10 Essential (primary) hypertension: Secondary | ICD-10-CM | POA: Diagnosis not present

## 2019-09-30 DIAGNOSIS — M1990 Unspecified osteoarthritis, unspecified site: Secondary | ICD-10-CM | POA: Diagnosis not present

## 2019-09-30 DIAGNOSIS — F418 Other specified anxiety disorders: Secondary | ICD-10-CM | POA: Diagnosis not present

## 2019-09-30 DIAGNOSIS — E7800 Pure hypercholesterolemia, unspecified: Secondary | ICD-10-CM | POA: Diagnosis not present

## 2019-09-30 DIAGNOSIS — J441 Chronic obstructive pulmonary disease with (acute) exacerbation: Secondary | ICD-10-CM | POA: Diagnosis not present

## 2019-09-30 DIAGNOSIS — E782 Mixed hyperlipidemia: Secondary | ICD-10-CM | POA: Diagnosis not present

## 2019-09-30 DIAGNOSIS — E1142 Type 2 diabetes mellitus with diabetic polyneuropathy: Secondary | ICD-10-CM | POA: Diagnosis not present

## 2019-10-01 DIAGNOSIS — R5383 Other fatigue: Secondary | ICD-10-CM | POA: Diagnosis not present

## 2019-10-01 DIAGNOSIS — F418 Other specified anxiety disorders: Secondary | ICD-10-CM | POA: Diagnosis not present

## 2019-10-01 DIAGNOSIS — E782 Mixed hyperlipidemia: Secondary | ICD-10-CM | POA: Diagnosis not present

## 2019-10-01 DIAGNOSIS — M1990 Unspecified osteoarthritis, unspecified site: Secondary | ICD-10-CM | POA: Diagnosis not present

## 2019-10-01 DIAGNOSIS — E1142 Type 2 diabetes mellitus with diabetic polyneuropathy: Secondary | ICD-10-CM | POA: Diagnosis not present

## 2019-10-01 DIAGNOSIS — J441 Chronic obstructive pulmonary disease with (acute) exacerbation: Secondary | ICD-10-CM | POA: Diagnosis not present

## 2019-10-01 DIAGNOSIS — E7800 Pure hypercholesterolemia, unspecified: Secondary | ICD-10-CM | POA: Diagnosis not present

## 2019-10-01 DIAGNOSIS — I10 Essential (primary) hypertension: Secondary | ICD-10-CM | POA: Diagnosis not present

## 2019-10-03 DIAGNOSIS — Z6834 Body mass index (BMI) 34.0-34.9, adult: Secondary | ICD-10-CM | POA: Diagnosis not present

## 2019-10-03 DIAGNOSIS — I10 Essential (primary) hypertension: Secondary | ICD-10-CM | POA: Diagnosis not present

## 2019-10-03 DIAGNOSIS — E119 Type 2 diabetes mellitus without complications: Secondary | ICD-10-CM | POA: Diagnosis not present

## 2019-10-03 DIAGNOSIS — F411 Generalized anxiety disorder: Secondary | ICD-10-CM | POA: Diagnosis not present

## 2019-10-03 DIAGNOSIS — Z0001 Encounter for general adult medical examination with abnormal findings: Secondary | ICD-10-CM | POA: Diagnosis not present

## 2019-10-03 DIAGNOSIS — J438 Other emphysema: Secondary | ICD-10-CM | POA: Diagnosis not present

## 2019-10-03 DIAGNOSIS — E782 Mixed hyperlipidemia: Secondary | ICD-10-CM | POA: Diagnosis not present

## 2019-10-03 DIAGNOSIS — Z1212 Encounter for screening for malignant neoplasm of rectum: Secondary | ICD-10-CM | POA: Diagnosis not present

## 2019-10-10 DIAGNOSIS — Z1231 Encounter for screening mammogram for malignant neoplasm of breast: Secondary | ICD-10-CM | POA: Diagnosis not present

## 2019-10-23 DIAGNOSIS — N6310 Unspecified lump in the right breast, unspecified quadrant: Secondary | ICD-10-CM | POA: Diagnosis not present

## 2019-10-23 DIAGNOSIS — R928 Other abnormal and inconclusive findings on diagnostic imaging of breast: Secondary | ICD-10-CM | POA: Diagnosis not present

## 2019-10-23 DIAGNOSIS — N6489 Other specified disorders of breast: Secondary | ICD-10-CM | POA: Diagnosis not present

## 2019-10-28 DIAGNOSIS — E7849 Other hyperlipidemia: Secondary | ICD-10-CM | POA: Diagnosis not present

## 2019-10-28 DIAGNOSIS — I10 Essential (primary) hypertension: Secondary | ICD-10-CM | POA: Diagnosis not present

## 2019-10-28 DIAGNOSIS — E1142 Type 2 diabetes mellitus with diabetic polyneuropathy: Secondary | ICD-10-CM | POA: Diagnosis not present

## 2019-10-28 DIAGNOSIS — K219 Gastro-esophageal reflux disease without esophagitis: Secondary | ICD-10-CM | POA: Diagnosis not present

## 2019-11-27 DIAGNOSIS — E7849 Other hyperlipidemia: Secondary | ICD-10-CM | POA: Diagnosis not present

## 2019-11-27 DIAGNOSIS — E1142 Type 2 diabetes mellitus with diabetic polyneuropathy: Secondary | ICD-10-CM | POA: Diagnosis not present

## 2019-11-27 DIAGNOSIS — K219 Gastro-esophageal reflux disease without esophagitis: Secondary | ICD-10-CM | POA: Diagnosis not present

## 2019-11-27 DIAGNOSIS — I10 Essential (primary) hypertension: Secondary | ICD-10-CM | POA: Diagnosis not present

## 2019-12-19 DIAGNOSIS — Z0389 Encounter for observation for other suspected diseases and conditions ruled out: Secondary | ICD-10-CM | POA: Diagnosis not present

## 2019-12-19 DIAGNOSIS — M810 Age-related osteoporosis without current pathological fracture: Secondary | ICD-10-CM | POA: Diagnosis not present

## 2019-12-27 DIAGNOSIS — E7849 Other hyperlipidemia: Secondary | ICD-10-CM | POA: Diagnosis not present

## 2019-12-27 DIAGNOSIS — I10 Essential (primary) hypertension: Secondary | ICD-10-CM | POA: Diagnosis not present

## 2019-12-27 DIAGNOSIS — E1142 Type 2 diabetes mellitus with diabetic polyneuropathy: Secondary | ICD-10-CM | POA: Diagnosis not present

## 2019-12-27 DIAGNOSIS — K219 Gastro-esophageal reflux disease without esophagitis: Secondary | ICD-10-CM | POA: Diagnosis not present

## 2019-12-30 ENCOUNTER — Ambulatory Visit: Payer: Medicare HMO | Attending: Family Medicine | Admitting: Neurology

## 2019-12-30 ENCOUNTER — Other Ambulatory Visit: Payer: Self-pay

## 2019-12-30 DIAGNOSIS — Z791 Long term (current) use of non-steroidal anti-inflammatories (NSAID): Secondary | ICD-10-CM | POA: Insufficient documentation

## 2019-12-30 DIAGNOSIS — Z79899 Other long term (current) drug therapy: Secondary | ICD-10-CM | POA: Insufficient documentation

## 2019-12-30 DIAGNOSIS — R0683 Snoring: Secondary | ICD-10-CM | POA: Insufficient documentation

## 2019-12-30 DIAGNOSIS — G4733 Obstructive sleep apnea (adult) (pediatric): Secondary | ICD-10-CM | POA: Insufficient documentation

## 2019-12-30 DIAGNOSIS — Z7901 Long term (current) use of anticoagulants: Secondary | ICD-10-CM | POA: Insufficient documentation

## 2019-12-30 DIAGNOSIS — R5383 Other fatigue: Secondary | ICD-10-CM

## 2019-12-30 DIAGNOSIS — G4710 Hypersomnia, unspecified: Secondary | ICD-10-CM

## 2020-01-11 NOTE — Procedures (Signed)
    [facility] Katia Hannen A. Gerilyn Pilgrim, MD     [email or web address]             HOME SLEEP STUDY   LOCATION: [facility]   Patient Name: Michaela Stafford, Michaela Stafford Date: 12/30/2019 Gender: Female D.O.B: 05/18/1956 Age (years): 64 Referring Provider: Estanislado Pandy Height (inches): 61 Interpreting Physician: Beryle Beams MD, ABSM Weight (lbs): 185 RPSGT: Peak, Robert BMI: 35 MRN: [id number] Neck Size: CLINICAL INFORMATION Sleep Study Type: HST     Indication for sleep study: N/A     Epworth Sleepiness Score: N/A  SLEEP STUDY TECHNIQUE A multi-channel overnight portable sleep study was performed. The channels recorded were: nasal airflow, thoracic respiratory movement, and oxygen saturation with a pulse oximetry. Snoring was also monitored.  MEDICATIONS Patient self administered medications include: N/A.  Current Outpatient Medications:  .  ALPRAZolam (XANAX) 0.5 MG tablet, Take 0.5 mg by mouth 3 (three) times daily as needed. for anxiety, Disp: , Rfl: 5 .  cyclobenzaprine (FLEXERIL) 10 MG tablet, TAKE 1 TABLET BY MOUTH EVERYDAY AT BEDTIME, Disp: , Rfl: 0 .  gabapentin (NEURONTIN) 100 MG capsule, Take 1 capsule (100 mg total) by mouth at bedtime., Disp: 30 capsule, Rfl: 1 .  lisinopril (PRINIVIL,ZESTRIL) 10 MG tablet, Take 10 mg by mouth daily., Disp: , Rfl: 4 .  meloxicam (MOBIC) 15 MG tablet, Take 15 mg by mouth daily. with food, Disp: , Rfl: 0 .  metoprolol succinate (TOPROL-XL) 50 MG 24 hr tablet, Take 50 mg by mouth daily., Disp: , Rfl: 3 .  pravastatin (PRAVACHOL) 20 MG tablet, Take 20 mg by mouth at bedtime., Disp: , Rfl: 3 .  PROAIR HFA 108 (90 Base) MCG/ACT inhaler, TAKE 2 PUFFS BY MOUTH 4 TIMES A DAY, Disp: , Rfl: 6 .  rizatriptan (MAXALT) 10 MG tablet, TAKE ONE TABLET BY MOUTH AS NEEDED FOR HEADACHE, Disp: , Rfl: 3 .  sertraline (ZOLOFT) 100 MG tablet, Take 100 mg by mouth daily., Disp: , Rfl: 1   SLEEP ARCHITECTURE Patient was studied for 304.1 minutes.  The sleep efficiency was 94.6 % and the patient was supine for 17.8%. The arousal index was 0.0 per hour.  RESPIRATORY PARAMETERS The overall AHI was 9.3 per hour, with a central apnea index of 6.3 per hour.  The oxygen nadir was 77% during sleep.     CARDIAC DATA Mean heart rate during sleep was 64.8 bpm.  IMPRESSIONS Mild obstructive sleep apnea occurred during this study (AHI = 9.3/h).  Consider AutoPap 5-12 given low oxygen saturation of 77%.    Argie Ramming, MD Diplomate, American Board of Sleep Medicine.      ELECTRONICALLY SIGNED ON:  01/11/2020, 3:55 PM [facility] PH: [phone]   FX: [phone] ACCREDITED BY THE AMERICAN ACADEMY OF SLEEP MEDICINE

## 2020-01-28 DIAGNOSIS — K219 Gastro-esophageal reflux disease without esophagitis: Secondary | ICD-10-CM | POA: Diagnosis not present

## 2020-01-28 DIAGNOSIS — E7849 Other hyperlipidemia: Secondary | ICD-10-CM | POA: Diagnosis not present

## 2020-01-28 DIAGNOSIS — I10 Essential (primary) hypertension: Secondary | ICD-10-CM | POA: Diagnosis not present

## 2020-01-28 DIAGNOSIS — E1142 Type 2 diabetes mellitus with diabetic polyneuropathy: Secondary | ICD-10-CM | POA: Diagnosis not present

## 2020-01-31 DIAGNOSIS — E782 Mixed hyperlipidemia: Secondary | ICD-10-CM | POA: Diagnosis not present

## 2020-01-31 DIAGNOSIS — Z209 Contact with and (suspected) exposure to unspecified communicable disease: Secondary | ICD-10-CM | POA: Diagnosis not present

## 2020-01-31 DIAGNOSIS — E7800 Pure hypercholesterolemia, unspecified: Secondary | ICD-10-CM | POA: Diagnosis not present

## 2020-01-31 DIAGNOSIS — I10 Essential (primary) hypertension: Secondary | ICD-10-CM | POA: Diagnosis not present

## 2020-01-31 DIAGNOSIS — J440 Chronic obstructive pulmonary disease with acute lower respiratory infection: Secondary | ICD-10-CM | POA: Diagnosis not present

## 2020-01-31 DIAGNOSIS — E1142 Type 2 diabetes mellitus with diabetic polyneuropathy: Secondary | ICD-10-CM | POA: Diagnosis not present

## 2020-02-10 DIAGNOSIS — G4733 Obstructive sleep apnea (adult) (pediatric): Secondary | ICD-10-CM | POA: Diagnosis not present

## 2020-02-10 DIAGNOSIS — J438 Other emphysema: Secondary | ICD-10-CM | POA: Diagnosis not present

## 2020-02-10 DIAGNOSIS — M545 Low back pain: Secondary | ICD-10-CM | POA: Diagnosis not present

## 2020-02-10 DIAGNOSIS — I10 Essential (primary) hypertension: Secondary | ICD-10-CM | POA: Diagnosis not present

## 2020-02-10 DIAGNOSIS — E782 Mixed hyperlipidemia: Secondary | ICD-10-CM | POA: Diagnosis not present

## 2020-02-10 DIAGNOSIS — Z6837 Body mass index (BMI) 37.0-37.9, adult: Secondary | ICD-10-CM | POA: Diagnosis not present

## 2020-02-10 DIAGNOSIS — E119 Type 2 diabetes mellitus without complications: Secondary | ICD-10-CM | POA: Diagnosis not present

## 2020-02-10 DIAGNOSIS — R5383 Other fatigue: Secondary | ICD-10-CM | POA: Diagnosis not present

## 2020-02-10 DIAGNOSIS — E6609 Other obesity due to excess calories: Secondary | ICD-10-CM | POA: Diagnosis not present

## 2020-02-10 DIAGNOSIS — F411 Generalized anxiety disorder: Secondary | ICD-10-CM | POA: Diagnosis not present

## 2020-02-27 DIAGNOSIS — E1142 Type 2 diabetes mellitus with diabetic polyneuropathy: Secondary | ICD-10-CM | POA: Diagnosis not present

## 2020-02-27 DIAGNOSIS — G4733 Obstructive sleep apnea (adult) (pediatric): Secondary | ICD-10-CM | POA: Diagnosis not present

## 2020-02-27 DIAGNOSIS — I10 Essential (primary) hypertension: Secondary | ICD-10-CM | POA: Diagnosis not present

## 2020-02-27 DIAGNOSIS — E7849 Other hyperlipidemia: Secondary | ICD-10-CM | POA: Diagnosis not present

## 2020-03-02 DIAGNOSIS — G4733 Obstructive sleep apnea (adult) (pediatric): Secondary | ICD-10-CM | POA: Diagnosis not present

## 2020-03-28 DIAGNOSIS — I10 Essential (primary) hypertension: Secondary | ICD-10-CM | POA: Diagnosis not present

## 2020-03-28 DIAGNOSIS — G4733 Obstructive sleep apnea (adult) (pediatric): Secondary | ICD-10-CM | POA: Diagnosis not present

## 2020-03-28 DIAGNOSIS — E7849 Other hyperlipidemia: Secondary | ICD-10-CM | POA: Diagnosis not present

## 2020-03-28 DIAGNOSIS — E1142 Type 2 diabetes mellitus with diabetic polyneuropathy: Secondary | ICD-10-CM | POA: Diagnosis not present

## 2020-04-17 DIAGNOSIS — Z23 Encounter for immunization: Secondary | ICD-10-CM | POA: Diagnosis not present

## 2020-04-17 DIAGNOSIS — M545 Low back pain, unspecified: Secondary | ICD-10-CM | POA: Diagnosis not present

## 2020-04-17 DIAGNOSIS — Z6836 Body mass index (BMI) 36.0-36.9, adult: Secondary | ICD-10-CM | POA: Diagnosis not present

## 2020-04-17 DIAGNOSIS — G4733 Obstructive sleep apnea (adult) (pediatric): Secondary | ICD-10-CM | POA: Diagnosis not present

## 2020-04-17 DIAGNOSIS — J449 Chronic obstructive pulmonary disease, unspecified: Secondary | ICD-10-CM | POA: Diagnosis not present

## 2020-04-28 DIAGNOSIS — G4733 Obstructive sleep apnea (adult) (pediatric): Secondary | ICD-10-CM | POA: Diagnosis not present

## 2020-04-28 DIAGNOSIS — E1142 Type 2 diabetes mellitus with diabetic polyneuropathy: Secondary | ICD-10-CM | POA: Diagnosis not present

## 2020-04-28 DIAGNOSIS — I10 Essential (primary) hypertension: Secondary | ICD-10-CM | POA: Diagnosis not present

## 2020-04-28 DIAGNOSIS — E7849 Other hyperlipidemia: Secondary | ICD-10-CM | POA: Diagnosis not present

## 2020-05-28 DIAGNOSIS — G4733 Obstructive sleep apnea (adult) (pediatric): Secondary | ICD-10-CM | POA: Diagnosis not present

## 2020-05-29 DIAGNOSIS — E7849 Other hyperlipidemia: Secondary | ICD-10-CM | POA: Diagnosis not present

## 2020-05-29 DIAGNOSIS — E1142 Type 2 diabetes mellitus with diabetic polyneuropathy: Secondary | ICD-10-CM | POA: Diagnosis not present

## 2020-05-29 DIAGNOSIS — I10 Essential (primary) hypertension: Secondary | ICD-10-CM | POA: Diagnosis not present

## 2020-06-04 DIAGNOSIS — G4733 Obstructive sleep apnea (adult) (pediatric): Secondary | ICD-10-CM | POA: Diagnosis not present

## 2020-06-09 DIAGNOSIS — E7800 Pure hypercholesterolemia, unspecified: Secondary | ICD-10-CM | POA: Diagnosis not present

## 2020-06-09 DIAGNOSIS — J440 Chronic obstructive pulmonary disease with acute lower respiratory infection: Secondary | ICD-10-CM | POA: Diagnosis not present

## 2020-06-09 DIAGNOSIS — E782 Mixed hyperlipidemia: Secondary | ICD-10-CM | POA: Diagnosis not present

## 2020-06-09 DIAGNOSIS — E1142 Type 2 diabetes mellitus with diabetic polyneuropathy: Secondary | ICD-10-CM | POA: Diagnosis not present

## 2020-06-11 DIAGNOSIS — G4733 Obstructive sleep apnea (adult) (pediatric): Secondary | ICD-10-CM | POA: Diagnosis not present

## 2020-06-11 DIAGNOSIS — E7849 Other hyperlipidemia: Secondary | ICD-10-CM | POA: Diagnosis not present

## 2020-06-11 DIAGNOSIS — E119 Type 2 diabetes mellitus without complications: Secondary | ICD-10-CM | POA: Diagnosis not present

## 2020-06-11 DIAGNOSIS — Z6838 Body mass index (BMI) 38.0-38.9, adult: Secondary | ICD-10-CM | POA: Diagnosis not present

## 2020-06-11 DIAGNOSIS — F411 Generalized anxiety disorder: Secondary | ICD-10-CM | POA: Diagnosis not present

## 2020-06-11 DIAGNOSIS — Z23 Encounter for immunization: Secondary | ICD-10-CM | POA: Diagnosis not present

## 2020-06-11 DIAGNOSIS — J438 Other emphysema: Secondary | ICD-10-CM | POA: Diagnosis not present

## 2020-06-11 DIAGNOSIS — I10 Essential (primary) hypertension: Secondary | ICD-10-CM | POA: Diagnosis not present

## 2020-06-27 DIAGNOSIS — E7849 Other hyperlipidemia: Secondary | ICD-10-CM | POA: Diagnosis not present

## 2020-06-27 DIAGNOSIS — E1142 Type 2 diabetes mellitus with diabetic polyneuropathy: Secondary | ICD-10-CM | POA: Diagnosis not present

## 2020-06-27 DIAGNOSIS — K219 Gastro-esophageal reflux disease without esophagitis: Secondary | ICD-10-CM | POA: Diagnosis not present

## 2020-06-27 DIAGNOSIS — I10 Essential (primary) hypertension: Secondary | ICD-10-CM | POA: Diagnosis not present

## 2020-06-28 DIAGNOSIS — G4733 Obstructive sleep apnea (adult) (pediatric): Secondary | ICD-10-CM | POA: Diagnosis not present

## 2020-07-27 DIAGNOSIS — E7849 Other hyperlipidemia: Secondary | ICD-10-CM | POA: Diagnosis not present

## 2020-07-27 DIAGNOSIS — I10 Essential (primary) hypertension: Secondary | ICD-10-CM | POA: Diagnosis not present

## 2020-07-27 DIAGNOSIS — K219 Gastro-esophageal reflux disease without esophagitis: Secondary | ICD-10-CM | POA: Diagnosis not present

## 2020-07-27 DIAGNOSIS — E1142 Type 2 diabetes mellitus with diabetic polyneuropathy: Secondary | ICD-10-CM | POA: Diagnosis not present

## 2020-07-27 DIAGNOSIS — G4733 Obstructive sleep apnea (adult) (pediatric): Secondary | ICD-10-CM | POA: Diagnosis not present

## 2020-08-13 DIAGNOSIS — M545 Low back pain, unspecified: Secondary | ICD-10-CM | POA: Diagnosis not present

## 2020-08-13 DIAGNOSIS — M1711 Unilateral primary osteoarthritis, right knee: Secondary | ICD-10-CM | POA: Diagnosis not present

## 2020-08-13 DIAGNOSIS — M170 Bilateral primary osteoarthritis of knee: Secondary | ICD-10-CM | POA: Diagnosis not present

## 2020-08-13 DIAGNOSIS — M1712 Unilateral primary osteoarthritis, left knee: Secondary | ICD-10-CM | POA: Diagnosis not present

## 2020-08-26 DIAGNOSIS — K219 Gastro-esophageal reflux disease without esophagitis: Secondary | ICD-10-CM | POA: Diagnosis not present

## 2020-08-26 DIAGNOSIS — E7849 Other hyperlipidemia: Secondary | ICD-10-CM | POA: Diagnosis not present

## 2020-08-26 DIAGNOSIS — I10 Essential (primary) hypertension: Secondary | ICD-10-CM | POA: Diagnosis not present

## 2020-08-26 DIAGNOSIS — G4733 Obstructive sleep apnea (adult) (pediatric): Secondary | ICD-10-CM | POA: Diagnosis not present

## 2020-08-26 DIAGNOSIS — E1142 Type 2 diabetes mellitus with diabetic polyneuropathy: Secondary | ICD-10-CM | POA: Diagnosis not present

## 2020-09-25 DIAGNOSIS — H524 Presbyopia: Secondary | ICD-10-CM | POA: Diagnosis not present

## 2020-09-26 DIAGNOSIS — E1142 Type 2 diabetes mellitus with diabetic polyneuropathy: Secondary | ICD-10-CM | POA: Diagnosis not present

## 2020-09-26 DIAGNOSIS — E7849 Other hyperlipidemia: Secondary | ICD-10-CM | POA: Diagnosis not present

## 2020-09-26 DIAGNOSIS — K219 Gastro-esophageal reflux disease without esophagitis: Secondary | ICD-10-CM | POA: Diagnosis not present

## 2020-09-26 DIAGNOSIS — I10 Essential (primary) hypertension: Secondary | ICD-10-CM | POA: Diagnosis not present

## 2020-09-26 DIAGNOSIS — G4733 Obstructive sleep apnea (adult) (pediatric): Secondary | ICD-10-CM | POA: Diagnosis not present

## 2020-10-02 DIAGNOSIS — R7309 Other abnormal glucose: Secondary | ICD-10-CM | POA: Diagnosis not present

## 2020-10-02 DIAGNOSIS — E782 Mixed hyperlipidemia: Secondary | ICD-10-CM | POA: Diagnosis not present

## 2020-10-02 DIAGNOSIS — E7800 Pure hypercholesterolemia, unspecified: Secondary | ICD-10-CM | POA: Diagnosis not present

## 2020-10-02 DIAGNOSIS — E7849 Other hyperlipidemia: Secondary | ICD-10-CM | POA: Diagnosis not present

## 2020-10-02 DIAGNOSIS — E1142 Type 2 diabetes mellitus with diabetic polyneuropathy: Secondary | ICD-10-CM | POA: Diagnosis not present

## 2020-10-02 DIAGNOSIS — E876 Hypokalemia: Secondary | ICD-10-CM | POA: Diagnosis not present

## 2020-10-02 DIAGNOSIS — E7801 Familial hypercholesterolemia: Secondary | ICD-10-CM | POA: Diagnosis not present

## 2020-10-07 DIAGNOSIS — G4733 Obstructive sleep apnea (adult) (pediatric): Secondary | ICD-10-CM | POA: Diagnosis not present

## 2020-10-07 DIAGNOSIS — J438 Other emphysema: Secondary | ICD-10-CM | POA: Diagnosis not present

## 2020-10-07 DIAGNOSIS — I10 Essential (primary) hypertension: Secondary | ICD-10-CM | POA: Diagnosis not present

## 2020-10-07 DIAGNOSIS — R5383 Other fatigue: Secondary | ICD-10-CM | POA: Diagnosis not present

## 2020-10-07 DIAGNOSIS — Z6838 Body mass index (BMI) 38.0-38.9, adult: Secondary | ICD-10-CM | POA: Diagnosis not present

## 2020-10-07 DIAGNOSIS — F411 Generalized anxiety disorder: Secondary | ICD-10-CM | POA: Diagnosis not present

## 2020-10-07 DIAGNOSIS — E7849 Other hyperlipidemia: Secondary | ICD-10-CM | POA: Diagnosis not present

## 2020-10-07 DIAGNOSIS — E119 Type 2 diabetes mellitus without complications: Secondary | ICD-10-CM | POA: Diagnosis not present

## 2020-10-15 ENCOUNTER — Other Ambulatory Visit: Payer: Self-pay | Admitting: Family Medicine

## 2020-10-15 ENCOUNTER — Other Ambulatory Visit (HOSPITAL_COMMUNITY): Payer: Self-pay | Admitting: Family Medicine

## 2020-10-15 DIAGNOSIS — M2392 Unspecified internal derangement of left knee: Secondary | ICD-10-CM

## 2020-10-15 DIAGNOSIS — M170 Bilateral primary osteoarthritis of knee: Secondary | ICD-10-CM | POA: Diagnosis not present

## 2020-10-26 DIAGNOSIS — G4733 Obstructive sleep apnea (adult) (pediatric): Secondary | ICD-10-CM | POA: Diagnosis not present

## 2020-11-03 ENCOUNTER — Ambulatory Visit (HOSPITAL_COMMUNITY)
Admission: RE | Admit: 2020-11-03 | Discharge: 2020-11-03 | Disposition: A | Discharge status: Home / Self Care | Payer: Medicare HMO | Source: Ambulatory Visit | Attending: Family Medicine | Admitting: Family Medicine

## 2020-11-03 DIAGNOSIS — M2392 Unspecified internal derangement of left knee: Secondary | ICD-10-CM | POA: Insufficient documentation

## 2020-11-03 DIAGNOSIS — M25562 Pain in left knee: Secondary | ICD-10-CM | POA: Diagnosis not present

## 2020-11-03 IMAGING — MR MR KNEE*L* W/O CM
7 series · 40 of 40 positions shown · non-contrast
Comparison: Radiograph report [DATE], images not retrievable at
the time of this dictation.

CLINICAL DATA: Left knee pain for 3 months

EXAM:
MRI OF THE LEFT KNEE WITHOUT CONTRAST
TECHNIQUE: Multiplanar, multisequence MR imaging of the knee was performed. No
intravenous contrast was administered.

[Series 8: T2 fat-sat · axial · left · 4.0mm · 0.47mm/px · z∈[-51,+74]mm · 5 of 26 slices shown (1 of 3)]
[im 1/26]
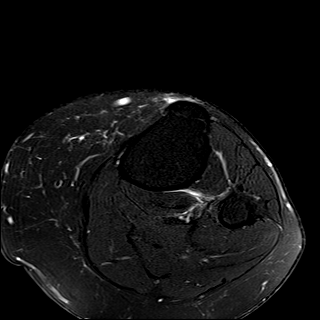
[im 7/26]
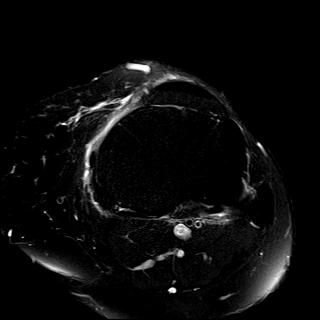
[im 13/26]
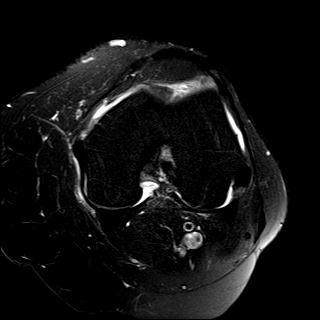
[im 19/26]
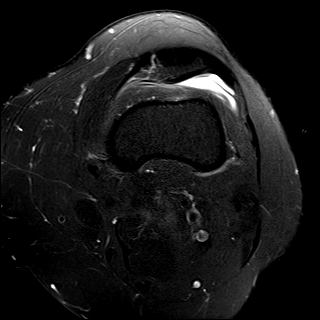
[im 26/26]
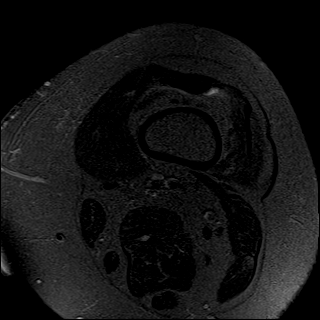

[Series 9: T1 · coronal · left · 4.0mm · 0.59mm/px · 5 of 24 slices shown]
[im 1/24]
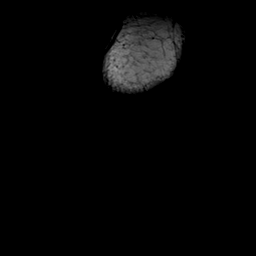
[im 6/24]
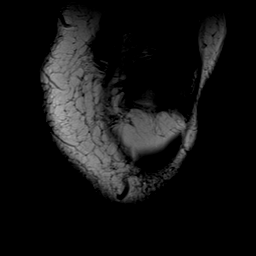
[im 12/24]
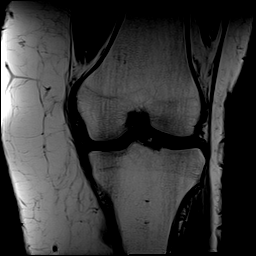
[im 18/24]
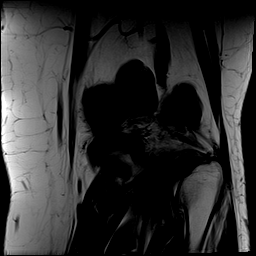
[im 24/24]
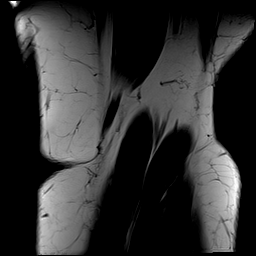

[Series 10: T2 fat-sat · coronal · left · 4.0mm · 0.59mm/px · 7 of 28 slices shown (2 of 3)]
[im 1/28]
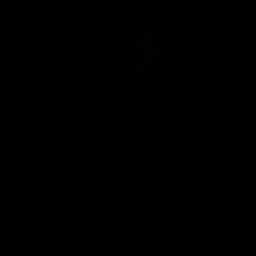
[im 5/28]
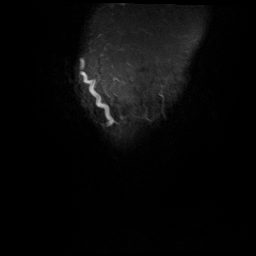
[im 10/28]
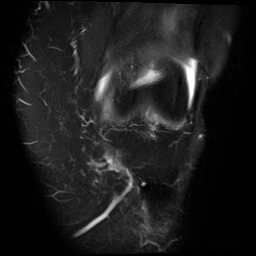
[im 14/28]
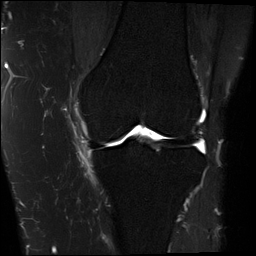
[im 19/28]
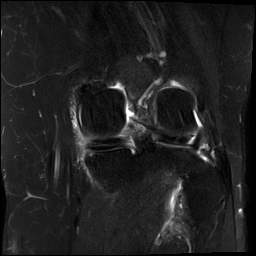
[im 23/28]
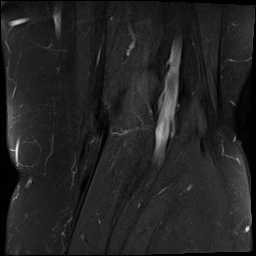
[im 28/28]
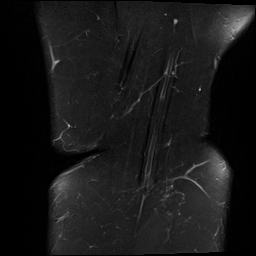

[Series 11: PD fat-sat · coronal · left · 4.0mm · 0.59mm/px · 7 of 28 slices shown (1 of 2)]
[im 1/28]
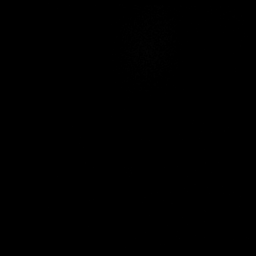
[im 5/28]
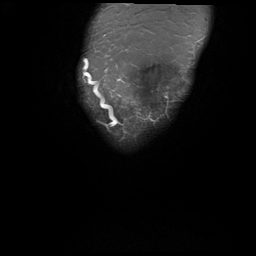
[im 10/28]
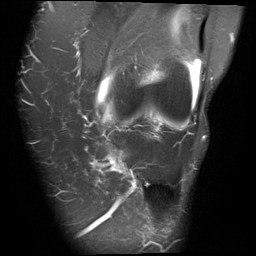
[im 14/28]
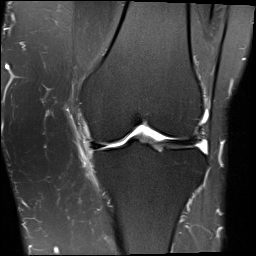
[im 19/28]
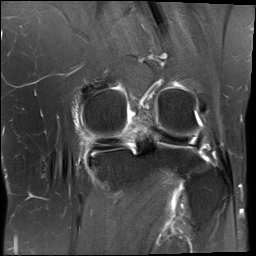
[im 23/28]
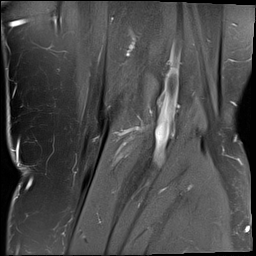
[im 28/28]
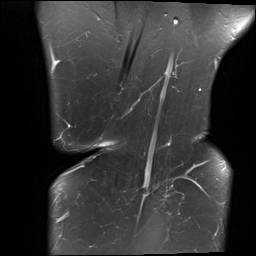

[Series 12: PD fat-sat · sagittal · left · 3.0mm · 0.59mm/px · 7 of 28 slices shown (2 of 2)]
[im 1/28]
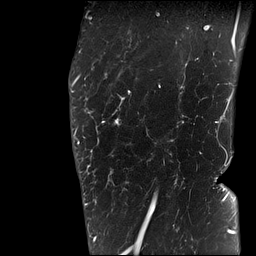
[im 5/28]
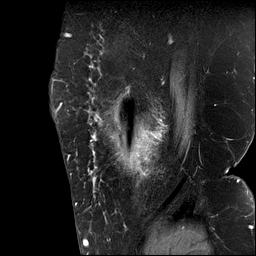
[im 10/28]
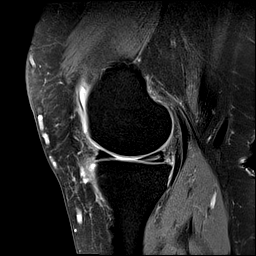
[im 14/28]
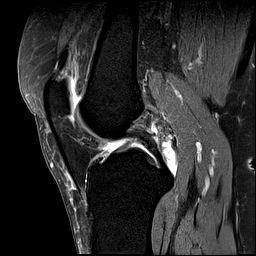
[im 19/28]
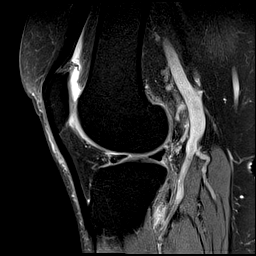
[im 23/28]
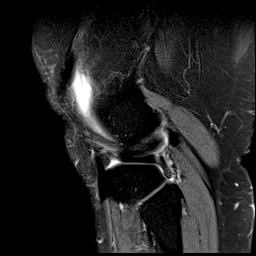
[im 28/28]
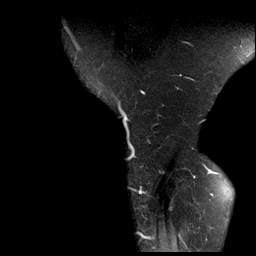

[Series 13: T2 fat-sat · sagittal · left · 3.0mm · 0.59mm/px · 7 of 28 slices shown (3 of 3)]
[im 1/28]
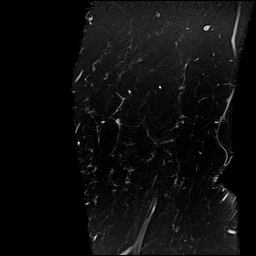
[im 5/28]
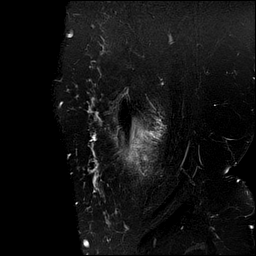
[im 10/28]
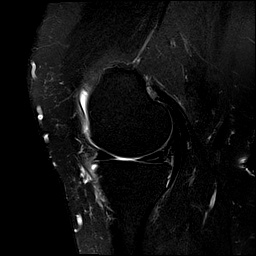
[im 14/28]
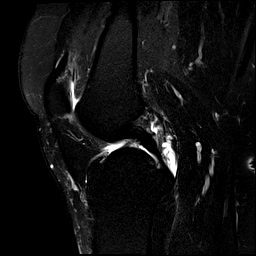
[im 19/28]
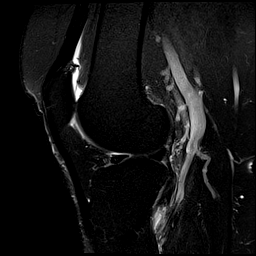
[im 23/28]
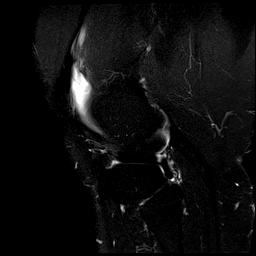
[im 28/28]
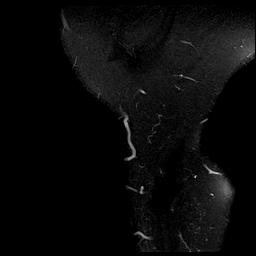

[Series 14: PD · coronal · left · 2.0mm · 0.47mm/px · 2 of 10 slices shown]
[im 1/10]
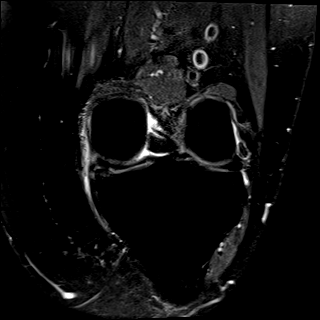
[im 10/10]
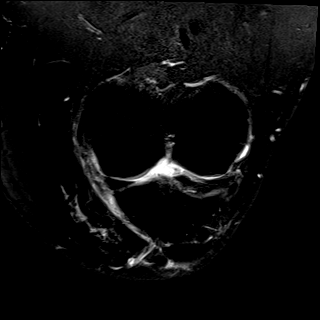

[40 of 40 positions shown; findings below may reference images not displayed]

FINDINGS: MENISCI

Medial: There is a horizontal tear of the posterior horn and body of
the medial meniscus, nondisplaced.

Lateral: Intact.

LIGAMENTS

Cruciates: ACL and PCL are intact.

Collaterals: Periligamentous edema along the MCL, which is otherwise
intact. Lateral collateral ligament complex is intact.

CARTILAGE

Patellofemoral:  No chondral defect.

Medial: Mild partial-thickness cartilage loss of the medial
femorotibial compartment.

Lateral:  No chondral defect.

JOINT: Small joint effusion.

POPLITEAL FOSSA: No Baker's cyst.

EXTENSOR MECHANISM: Intact quadriceps tendon. Intact patellar
tendon. Intact lateral patellar retinaculum. Intact medial patellar
retinaculum. Intact MPFL.

BONES: No aggressive osseous lesion. No fracture or dislocation.

Other: No fluid collection or hematoma. Muscles are normal.
IMPRESSION: Nondisplaced horizontal tear of the posterior horn and body of the
medial meniscus.

Periligamentous edema along the MCL, which can be seen with a grade
1 sprain.

Small joint effusion.  Mild medial compartment chondrosis.

## 2020-11-05 DIAGNOSIS — S83282A Other tear of lateral meniscus, current injury, left knee, initial encounter: Secondary | ICD-10-CM | POA: Diagnosis not present

## 2020-11-12 DIAGNOSIS — J449 Chronic obstructive pulmonary disease, unspecified: Secondary | ICD-10-CM | POA: Diagnosis not present

## 2020-11-12 DIAGNOSIS — S83207A Unspecified tear of unspecified meniscus, current injury, left knee, initial encounter: Secondary | ICD-10-CM | POA: Diagnosis not present

## 2020-11-12 DIAGNOSIS — Z1331 Encounter for screening for depression: Secondary | ICD-10-CM | POA: Diagnosis not present

## 2020-11-12 DIAGNOSIS — Z1389 Encounter for screening for other disorder: Secondary | ICD-10-CM | POA: Diagnosis not present

## 2020-11-12 DIAGNOSIS — G4733 Obstructive sleep apnea (adult) (pediatric): Secondary | ICD-10-CM | POA: Diagnosis not present

## 2020-11-12 DIAGNOSIS — Z6837 Body mass index (BMI) 37.0-37.9, adult: Secondary | ICD-10-CM | POA: Diagnosis not present

## 2020-11-20 DIAGNOSIS — G8918 Other acute postprocedural pain: Secondary | ICD-10-CM | POA: Diagnosis not present

## 2020-11-20 DIAGNOSIS — M23304 Other meniscus derangements, unspecified medial meniscus, left knee: Secondary | ICD-10-CM | POA: Diagnosis not present

## 2020-11-20 DIAGNOSIS — S83242A Other tear of medial meniscus, current injury, left knee, initial encounter: Secondary | ICD-10-CM | POA: Diagnosis not present

## 2020-11-26 ENCOUNTER — Other Ambulatory Visit: Payer: Self-pay

## 2020-11-26 ENCOUNTER — Other Ambulatory Visit (HOSPITAL_COMMUNITY): Payer: Self-pay | Admitting: Orthopedic Surgery

## 2020-11-26 ENCOUNTER — Ambulatory Visit (HOSPITAL_COMMUNITY)
Admission: RE | Admit: 2020-11-26 | Discharge: 2020-11-26 | Disposition: A | Discharge status: Home / Self Care | Payer: Medicare HMO | Source: Ambulatory Visit | Attending: Family Medicine | Admitting: Family Medicine

## 2020-11-26 DIAGNOSIS — M25662 Stiffness of left knee, not elsewhere classified: Secondary | ICD-10-CM | POA: Diagnosis not present

## 2020-11-26 DIAGNOSIS — M7989 Other specified soft tissue disorders: Secondary | ICD-10-CM | POA: Diagnosis not present

## 2020-11-26 DIAGNOSIS — M79662 Pain in left lower leg: Secondary | ICD-10-CM | POA: Diagnosis not present

## 2020-11-26 DIAGNOSIS — R262 Difficulty in walking, not elsewhere classified: Secondary | ICD-10-CM | POA: Diagnosis not present

## 2020-11-26 DIAGNOSIS — M25562 Pain in left knee: Secondary | ICD-10-CM | POA: Diagnosis not present

## 2020-11-26 DIAGNOSIS — G4733 Obstructive sleep apnea (adult) (pediatric): Secondary | ICD-10-CM | POA: Diagnosis not present

## 2020-11-26 DIAGNOSIS — M25561 Pain in right knee: Secondary | ICD-10-CM | POA: Diagnosis not present

## 2020-11-26 DIAGNOSIS — R531 Weakness: Secondary | ICD-10-CM | POA: Diagnosis not present

## 2020-11-26 NOTE — Progress Notes (Signed)
Lower extremity venous has been completed.   Preliminary results in [facility] Proc.   Blanch Media 11/26/2020 4:13 PM

## 2020-11-27 ENCOUNTER — Other Ambulatory Visit: Payer: Self-pay | Admitting: Orthopedic Surgery

## 2020-11-27 ENCOUNTER — Other Ambulatory Visit (HOSPITAL_COMMUNITY): Payer: Self-pay | Admitting: Orthopedic Surgery

## 2020-11-27 DIAGNOSIS — M25561 Pain in right knee: Secondary | ICD-10-CM

## 2020-12-02 DIAGNOSIS — R531 Weakness: Secondary | ICD-10-CM | POA: Diagnosis not present

## 2020-12-02 DIAGNOSIS — M25662 Stiffness of left knee, not elsewhere classified: Secondary | ICD-10-CM | POA: Diagnosis not present

## 2020-12-02 DIAGNOSIS — R262 Difficulty in walking, not elsewhere classified: Secondary | ICD-10-CM | POA: Diagnosis not present

## 2020-12-08 DIAGNOSIS — R262 Difficulty in walking, not elsewhere classified: Secondary | ICD-10-CM | POA: Diagnosis not present

## 2020-12-08 DIAGNOSIS — R531 Weakness: Secondary | ICD-10-CM | POA: Diagnosis not present

## 2020-12-08 DIAGNOSIS — M25662 Stiffness of left knee, not elsewhere classified: Secondary | ICD-10-CM | POA: Diagnosis not present

## 2020-12-15 ENCOUNTER — Ambulatory Visit (HOSPITAL_COMMUNITY)
Admission: RE | Admit: 2020-12-15 | Discharge: 2020-12-15 | Disposition: A | Discharge status: Home / Self Care | Payer: Medicare HMO | Source: Ambulatory Visit | Attending: Orthopedic Surgery | Admitting: Orthopedic Surgery

## 2020-12-15 ENCOUNTER — Other Ambulatory Visit: Payer: Self-pay

## 2020-12-15 DIAGNOSIS — M25562 Pain in left knee: Secondary | ICD-10-CM | POA: Insufficient documentation

## 2020-12-15 DIAGNOSIS — M25561 Pain in right knee: Secondary | ICD-10-CM | POA: Diagnosis not present

## 2020-12-15 DIAGNOSIS — R600 Localized edema: Secondary | ICD-10-CM | POA: Diagnosis not present

## 2020-12-15 DIAGNOSIS — M25461 Effusion, right knee: Secondary | ICD-10-CM | POA: Diagnosis not present

## 2020-12-15 IMAGING — MR MR KNEE*R* W/O CM
7 series · 40 of 40 positions shown · non-contrast
Comparison: None.

CLINICAL DATA: Chronic right knee pain

EXAM:
MRI OF THE RIGHT KNEE WITHOUT CONTRAST
TECHNIQUE: Multiplanar, multisequence MR imaging of the knee was performed. No
intravenous contrast was administered.

[Series 8: T2 fat-sat · axial · right · 4.0mm · 0.47mm/px · z∈[-54,+70]mm · 5 of 26 slices shown (1 of 3)]
[im 1/26]
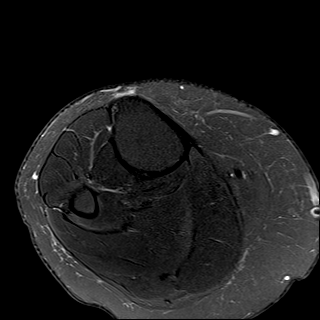
[im 7/26]
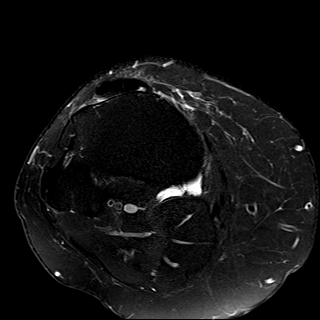
[im 13/26]
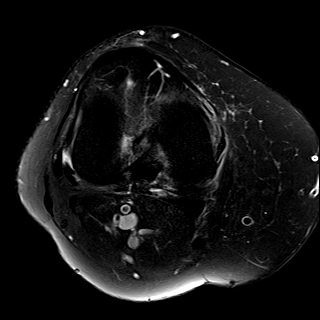
[im 19/26]
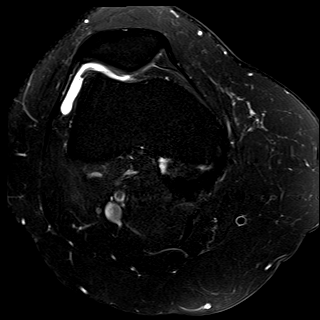
[im 26/26]
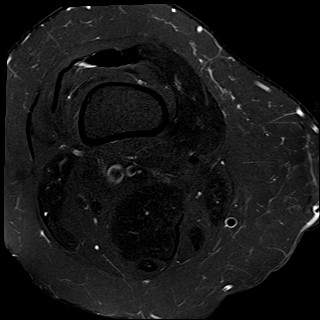

[Series 9: T1 · coronal · right · 4.0mm · 0.59mm/px · 6 of 28 slices shown]
[im 1/28]
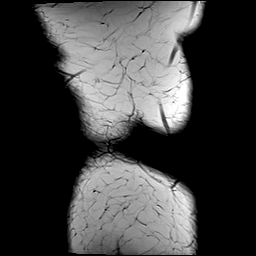
[im 6/28]
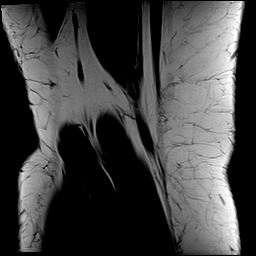
[im 11/28]
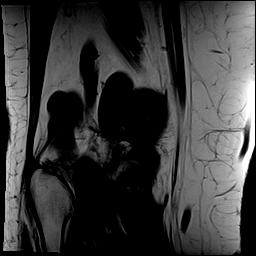
[im 17/28]
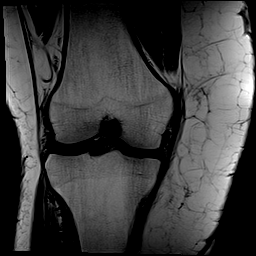
[im 22/28]
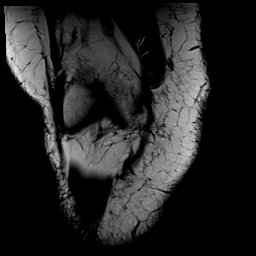
[im 28/28]
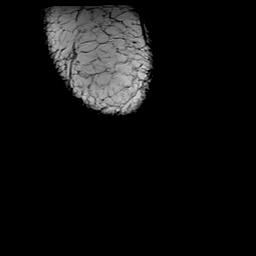

[Series 10: T2 fat-sat · coronal · right · 4.0mm · 0.59mm/px · 6 of 28 slices shown (2 of 3)]
[im 1/28]
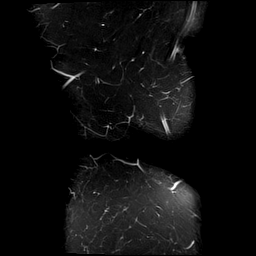
[im 6/28]
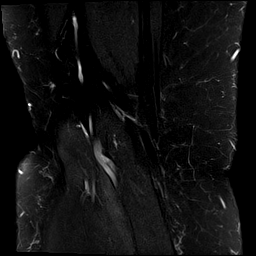
[im 11/28]
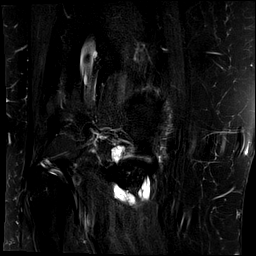
[im 17/28]
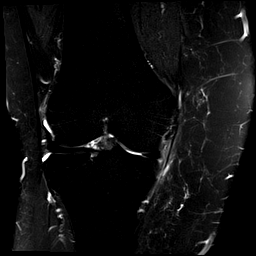
[im 22/28]
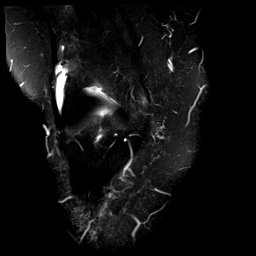
[im 28/28]
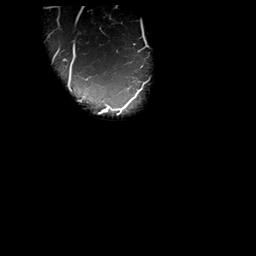

[Series 11: PD fat-sat · coronal · right · 3.0mm · 0.59mm/px · 7 of 36 slices shown (1 of 2)]
[im 1/36]
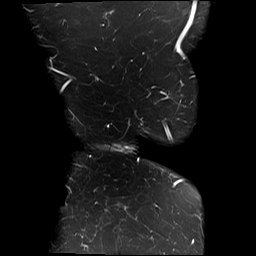
[im 6/36]
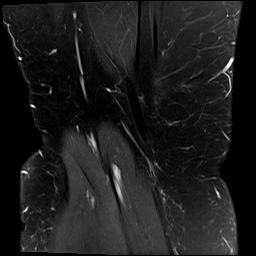
[im 12/36]
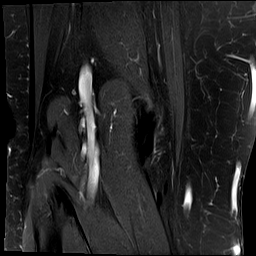
[im 18/36]
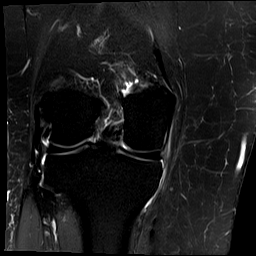
[im 24/36]
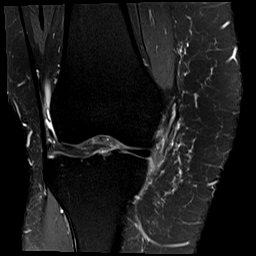
[im 30/36]
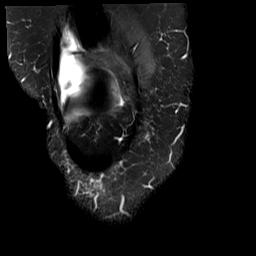
[im 36/36]
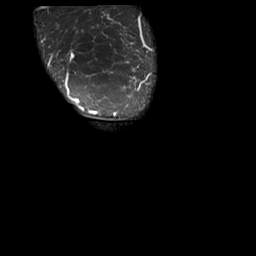

[Series 12: PD fat-sat · sagittal · right · 3.0mm · 0.59mm/px · 6 of 28 slices shown (2 of 2)]
[im 1/28]
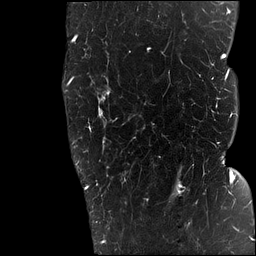
[im 6/28]
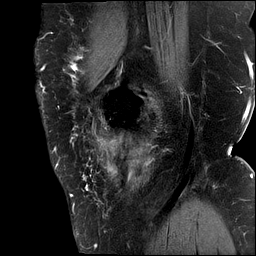
[im 11/28]
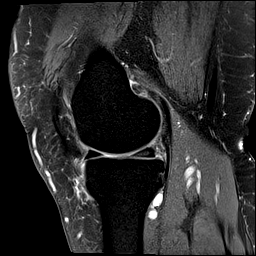
[im 17/28]
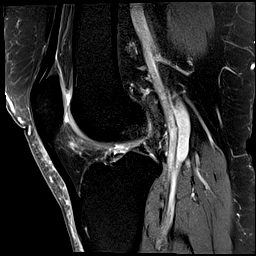
[im 22/28]
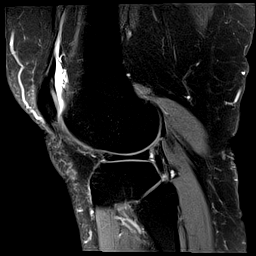
[im 28/28]
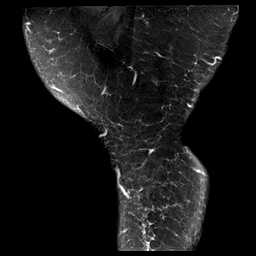

[Series 13: T2 fat-sat · sagittal · right · 3.0mm · 0.59mm/px · 6 of 28 slices shown (3 of 3)]
[im 1/28]
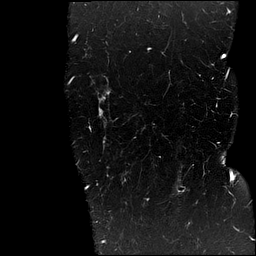
[im 6/28]
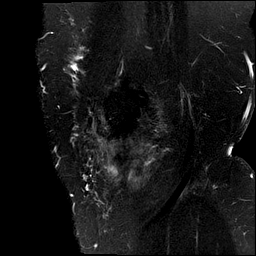
[im 11/28]
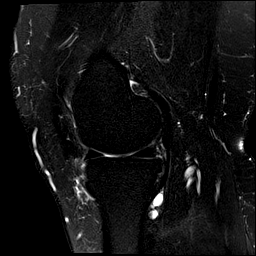
[im 17/28]
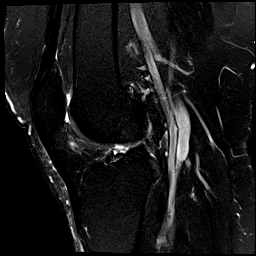
[im 22/28]
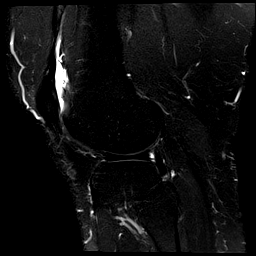
[im 28/28]
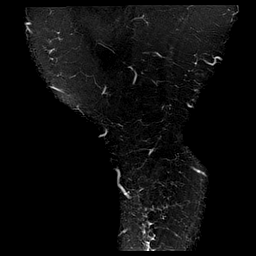

[Series 14: PD · coronal · right · 2.0mm · 0.47mm/px · 4 of 20 slices shown]
[im 1/20]
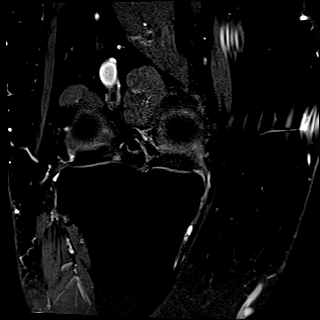
[im 7/20]
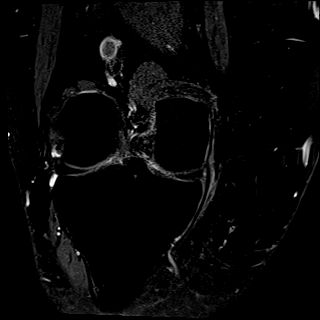
[im 13/20]
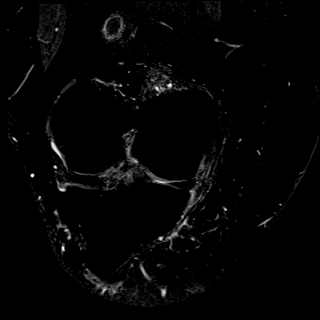
[im 20/20]
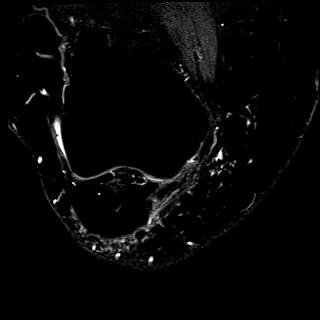

[40 of 40 positions shown; findings below may reference images not displayed]

FINDINGS: MENISCI

Medial: There is intrasubstance signal within the posterior horn and
body of the medial meniscus, and small incomplete focal radial tear,
nondisplaced extending from the free edge to the periphery in the
medial meniscal body (sagittal PD image 20-22).

Lateral: Intact.

LIGAMENTS

Cruciates: ACL and PCL are intact.

Collaterals: Mild periligamentous edema along the medial collateral
ligament. Lateral collateral ligament complex is intact.

CARTILAGE

Patellofemoral:  No chondral defect.

Medial:  No chondral defect.

Lateral:  No chondral defect.

JOINT: Small joint effusion.

POPLITEAL FOSSA: No Baker's cyst.

EXTENSOR MECHANISM: Intact quadriceps tendon. Intact patellar
tendon. Intact lateral patellar retinaculum. Intact medial patellar
retinaculum. Intact MPFL.

BONES: No aggressive osseous lesion. No fracture or dislocation.

Other: There is distension of the semimembranosus-tibial collateral
ligament bursa.
IMPRESSION: Intrasubstance degeneration within the posterior horn and body of
the medial meniscus, with small focal incomplete radial tear
extending from the free edge to the periphery in the medial meniscal
body.

Small joint effusion. Semimembranosus-TCL bursitis.

Mild periligamentous edema along the medial collateral ligament,
which can be seen in the setting of a grade 1 MCL sprain.

## 2020-12-17 DIAGNOSIS — S83241A Other tear of medial meniscus, current injury, right knee, initial encounter: Secondary | ICD-10-CM | POA: Diagnosis not present

## 2020-12-17 DIAGNOSIS — R531 Weakness: Secondary | ICD-10-CM | POA: Diagnosis not present

## 2020-12-17 DIAGNOSIS — R262 Difficulty in walking, not elsewhere classified: Secondary | ICD-10-CM | POA: Diagnosis not present

## 2020-12-17 DIAGNOSIS — M25662 Stiffness of left knee, not elsewhere classified: Secondary | ICD-10-CM | POA: Diagnosis not present

## 2020-12-22 DIAGNOSIS — R531 Weakness: Secondary | ICD-10-CM | POA: Diagnosis not present

## 2020-12-22 DIAGNOSIS — M25662 Stiffness of left knee, not elsewhere classified: Secondary | ICD-10-CM | POA: Diagnosis not present

## 2020-12-22 DIAGNOSIS — R262 Difficulty in walking, not elsewhere classified: Secondary | ICD-10-CM | POA: Diagnosis not present

## 2020-12-23 DIAGNOSIS — G4733 Obstructive sleep apnea (adult) (pediatric): Secondary | ICD-10-CM | POA: Diagnosis not present

## 2020-12-26 DIAGNOSIS — G4733 Obstructive sleep apnea (adult) (pediatric): Secondary | ICD-10-CM | POA: Diagnosis not present

## 2020-12-27 DIAGNOSIS — E1142 Type 2 diabetes mellitus with diabetic polyneuropathy: Secondary | ICD-10-CM | POA: Diagnosis not present

## 2020-12-27 DIAGNOSIS — I10 Essential (primary) hypertension: Secondary | ICD-10-CM | POA: Diagnosis not present

## 2020-12-27 DIAGNOSIS — K219 Gastro-esophageal reflux disease without esophagitis: Secondary | ICD-10-CM | POA: Diagnosis not present

## 2020-12-27 DIAGNOSIS — E7849 Other hyperlipidemia: Secondary | ICD-10-CM | POA: Diagnosis not present

## 2020-12-29 DIAGNOSIS — R262 Difficulty in walking, not elsewhere classified: Secondary | ICD-10-CM | POA: Diagnosis not present

## 2020-12-29 DIAGNOSIS — R531 Weakness: Secondary | ICD-10-CM | POA: Diagnosis not present

## 2020-12-29 DIAGNOSIS — M25662 Stiffness of left knee, not elsewhere classified: Secondary | ICD-10-CM | POA: Diagnosis not present

## 2020-12-31 DIAGNOSIS — R262 Difficulty in walking, not elsewhere classified: Secondary | ICD-10-CM | POA: Diagnosis not present

## 2020-12-31 DIAGNOSIS — R531 Weakness: Secondary | ICD-10-CM | POA: Diagnosis not present

## 2020-12-31 DIAGNOSIS — M25662 Stiffness of left knee, not elsewhere classified: Secondary | ICD-10-CM | POA: Diagnosis not present

## 2021-01-05 DIAGNOSIS — R262 Difficulty in walking, not elsewhere classified: Secondary | ICD-10-CM | POA: Diagnosis not present

## 2021-01-05 DIAGNOSIS — M25662 Stiffness of left knee, not elsewhere classified: Secondary | ICD-10-CM | POA: Diagnosis not present

## 2021-01-05 DIAGNOSIS — R531 Weakness: Secondary | ICD-10-CM | POA: Diagnosis not present

## 2021-01-07 DIAGNOSIS — R262 Difficulty in walking, not elsewhere classified: Secondary | ICD-10-CM | POA: Diagnosis not present

## 2021-01-07 DIAGNOSIS — M25662 Stiffness of left knee, not elsewhere classified: Secondary | ICD-10-CM | POA: Diagnosis not present

## 2021-01-07 DIAGNOSIS — R531 Weakness: Secondary | ICD-10-CM | POA: Diagnosis not present

## 2021-01-15 DIAGNOSIS — M94261 Chondromalacia, right knee: Secondary | ICD-10-CM | POA: Diagnosis not present

## 2021-01-15 DIAGNOSIS — M23303 Other meniscus derangements, unspecified medial meniscus, right knee: Secondary | ICD-10-CM | POA: Diagnosis not present

## 2021-01-15 DIAGNOSIS — G8918 Other acute postprocedural pain: Secondary | ICD-10-CM | POA: Diagnosis not present

## 2021-01-15 DIAGNOSIS — S83241A Other tear of medial meniscus, current injury, right knee, initial encounter: Secondary | ICD-10-CM | POA: Diagnosis not present

## 2021-01-26 DIAGNOSIS — G4733 Obstructive sleep apnea (adult) (pediatric): Secondary | ICD-10-CM | POA: Diagnosis not present

## 2021-01-27 DIAGNOSIS — E7849 Other hyperlipidemia: Secondary | ICD-10-CM | POA: Diagnosis not present

## 2021-01-27 DIAGNOSIS — E1142 Type 2 diabetes mellitus with diabetic polyneuropathy: Secondary | ICD-10-CM | POA: Diagnosis not present

## 2021-01-27 DIAGNOSIS — K219 Gastro-esophageal reflux disease without esophagitis: Secondary | ICD-10-CM | POA: Diagnosis not present

## 2021-01-27 DIAGNOSIS — I10 Essential (primary) hypertension: Secondary | ICD-10-CM | POA: Diagnosis not present

## 2021-02-03 DIAGNOSIS — M6281 Muscle weakness (generalized): Secondary | ICD-10-CM | POA: Diagnosis not present

## 2021-02-03 DIAGNOSIS — M25661 Stiffness of right knee, not elsewhere classified: Secondary | ICD-10-CM | POA: Diagnosis not present

## 2021-02-03 DIAGNOSIS — M25662 Stiffness of left knee, not elsewhere classified: Secondary | ICD-10-CM | POA: Diagnosis not present

## 2021-02-09 DIAGNOSIS — M25662 Stiffness of left knee, not elsewhere classified: Secondary | ICD-10-CM | POA: Diagnosis not present

## 2021-02-09 DIAGNOSIS — M25661 Stiffness of right knee, not elsewhere classified: Secondary | ICD-10-CM | POA: Diagnosis not present

## 2021-02-09 DIAGNOSIS — M6281 Muscle weakness (generalized): Secondary | ICD-10-CM | POA: Diagnosis not present

## 2021-02-11 DIAGNOSIS — M25662 Stiffness of left knee, not elsewhere classified: Secondary | ICD-10-CM | POA: Diagnosis not present

## 2021-02-11 DIAGNOSIS — M25661 Stiffness of right knee, not elsewhere classified: Secondary | ICD-10-CM | POA: Diagnosis not present

## 2021-02-11 DIAGNOSIS — M6281 Muscle weakness (generalized): Secondary | ICD-10-CM | POA: Diagnosis not present

## 2021-02-16 DIAGNOSIS — Z96651 Presence of right artificial knee joint: Secondary | ICD-10-CM | POA: Diagnosis not present

## 2021-02-16 DIAGNOSIS — Z471 Aftercare following joint replacement surgery: Secondary | ICD-10-CM | POA: Diagnosis not present

## 2021-02-18 DIAGNOSIS — M6281 Muscle weakness (generalized): Secondary | ICD-10-CM | POA: Diagnosis not present

## 2021-02-18 DIAGNOSIS — M25662 Stiffness of left knee, not elsewhere classified: Secondary | ICD-10-CM | POA: Diagnosis not present

## 2021-02-18 DIAGNOSIS — M25661 Stiffness of right knee, not elsewhere classified: Secondary | ICD-10-CM | POA: Diagnosis not present

## 2021-02-26 DIAGNOSIS — I10 Essential (primary) hypertension: Secondary | ICD-10-CM | POA: Diagnosis not present

## 2021-02-26 DIAGNOSIS — E7849 Other hyperlipidemia: Secondary | ICD-10-CM | POA: Diagnosis not present

## 2021-02-26 DIAGNOSIS — K219 Gastro-esophageal reflux disease without esophagitis: Secondary | ICD-10-CM | POA: Diagnosis not present

## 2021-02-26 DIAGNOSIS — G4733 Obstructive sleep apnea (adult) (pediatric): Secondary | ICD-10-CM | POA: Diagnosis not present

## 2021-02-26 DIAGNOSIS — E1142 Type 2 diabetes mellitus with diabetic polyneuropathy: Secondary | ICD-10-CM | POA: Diagnosis not present

## 2021-03-23 DIAGNOSIS — G4733 Obstructive sleep apnea (adult) (pediatric): Secondary | ICD-10-CM | POA: Diagnosis not present

## 2021-03-28 DIAGNOSIS — G4733 Obstructive sleep apnea (adult) (pediatric): Secondary | ICD-10-CM | POA: Diagnosis not present

## 2021-03-29 DIAGNOSIS — I10 Essential (primary) hypertension: Secondary | ICD-10-CM | POA: Diagnosis not present

## 2021-03-29 DIAGNOSIS — K219 Gastro-esophageal reflux disease without esophagitis: Secondary | ICD-10-CM | POA: Diagnosis not present

## 2021-03-29 DIAGNOSIS — E1142 Type 2 diabetes mellitus with diabetic polyneuropathy: Secondary | ICD-10-CM | POA: Diagnosis not present

## 2021-03-29 DIAGNOSIS — E7849 Other hyperlipidemia: Secondary | ICD-10-CM | POA: Diagnosis not present

## 2021-04-27 DIAGNOSIS — Z20828 Contact with and (suspected) exposure to other viral communicable diseases: Secondary | ICD-10-CM | POA: Diagnosis not present

## 2021-04-27 DIAGNOSIS — J111 Influenza due to unidentified influenza virus with other respiratory manifestations: Secondary | ICD-10-CM | POA: Diagnosis not present

## 2021-04-28 DIAGNOSIS — K219 Gastro-esophageal reflux disease without esophagitis: Secondary | ICD-10-CM | POA: Diagnosis not present

## 2021-04-28 DIAGNOSIS — E7849 Other hyperlipidemia: Secondary | ICD-10-CM | POA: Diagnosis not present

## 2021-04-28 DIAGNOSIS — E1142 Type 2 diabetes mellitus with diabetic polyneuropathy: Secondary | ICD-10-CM | POA: Diagnosis not present

## 2021-04-28 DIAGNOSIS — G4733 Obstructive sleep apnea (adult) (pediatric): Secondary | ICD-10-CM | POA: Diagnosis not present

## 2021-04-28 DIAGNOSIS — I10 Essential (primary) hypertension: Secondary | ICD-10-CM | POA: Diagnosis not present

## 2021-05-11 DIAGNOSIS — M25561 Pain in right knee: Secondary | ICD-10-CM | POA: Diagnosis not present

## 2021-05-11 DIAGNOSIS — M25562 Pain in left knee: Secondary | ICD-10-CM | POA: Diagnosis not present

## 2021-05-13 DIAGNOSIS — R609 Edema, unspecified: Secondary | ICD-10-CM | POA: Diagnosis not present

## 2021-05-13 DIAGNOSIS — I872 Venous insufficiency (chronic) (peripheral): Secondary | ICD-10-CM | POA: Diagnosis not present

## 2021-05-13 DIAGNOSIS — Z23 Encounter for immunization: Secondary | ICD-10-CM | POA: Diagnosis not present

## 2021-05-13 DIAGNOSIS — Z6841 Body Mass Index (BMI) 40.0 and over, adult: Secondary | ICD-10-CM | POA: Diagnosis not present

## 2021-06-27 DIAGNOSIS — E7849 Other hyperlipidemia: Secondary | ICD-10-CM | POA: Diagnosis not present

## 2021-06-27 DIAGNOSIS — E1142 Type 2 diabetes mellitus with diabetic polyneuropathy: Secondary | ICD-10-CM | POA: Diagnosis not present

## 2021-06-27 DIAGNOSIS — I10 Essential (primary) hypertension: Secondary | ICD-10-CM | POA: Diagnosis not present

## 2021-08-11 DIAGNOSIS — R5383 Other fatigue: Secondary | ICD-10-CM | POA: Diagnosis not present

## 2021-08-11 DIAGNOSIS — E7849 Other hyperlipidemia: Secondary | ICD-10-CM | POA: Diagnosis not present

## 2021-08-11 DIAGNOSIS — Z6841 Body Mass Index (BMI) 40.0 and over, adult: Secondary | ICD-10-CM | POA: Diagnosis not present

## 2021-08-11 DIAGNOSIS — E782 Mixed hyperlipidemia: Secondary | ICD-10-CM | POA: Diagnosis not present

## 2021-08-11 DIAGNOSIS — E119 Type 2 diabetes mellitus without complications: Secondary | ICD-10-CM | POA: Diagnosis not present

## 2021-08-11 DIAGNOSIS — M545 Low back pain, unspecified: Secondary | ICD-10-CM | POA: Diagnosis not present

## 2021-08-11 DIAGNOSIS — J438 Other emphysema: Secondary | ICD-10-CM | POA: Diagnosis not present

## 2021-08-11 DIAGNOSIS — F411 Generalized anxiety disorder: Secondary | ICD-10-CM | POA: Diagnosis not present

## 2021-08-11 DIAGNOSIS — I10 Essential (primary) hypertension: Secondary | ICD-10-CM | POA: Diagnosis not present

## 2021-08-11 DIAGNOSIS — G4733 Obstructive sleep apnea (adult) (pediatric): Secondary | ICD-10-CM | POA: Diagnosis not present

## 2021-08-11 DIAGNOSIS — E6609 Other obesity due to excess calories: Secondary | ICD-10-CM | POA: Diagnosis not present

## 2021-09-08 DIAGNOSIS — H524 Presbyopia: Secondary | ICD-10-CM | POA: Diagnosis not present

## 2021-09-08 DIAGNOSIS — Z0100 Encounter for examination of eyes and vision without abnormal findings: Secondary | ICD-10-CM | POA: Diagnosis not present

## 2021-09-09 DIAGNOSIS — Z1231 Encounter for screening mammogram for malignant neoplasm of breast: Secondary | ICD-10-CM | POA: Diagnosis not present

## 2021-11-02 ENCOUNTER — Ambulatory Visit: Payer: Medicare HMO | Admitting: Podiatry

## 2021-11-11 DIAGNOSIS — E782 Mixed hyperlipidemia: Secondary | ICD-10-CM | POA: Diagnosis not present

## 2021-11-11 DIAGNOSIS — E876 Hypokalemia: Secondary | ICD-10-CM | POA: Diagnosis not present

## 2021-11-11 DIAGNOSIS — E1142 Type 2 diabetes mellitus with diabetic polyneuropathy: Secondary | ICD-10-CM | POA: Diagnosis not present

## 2021-11-11 DIAGNOSIS — E7849 Other hyperlipidemia: Secondary | ICD-10-CM | POA: Diagnosis not present

## 2021-11-11 DIAGNOSIS — R5383 Other fatigue: Secondary | ICD-10-CM | POA: Diagnosis not present

## 2021-11-15 DIAGNOSIS — R5383 Other fatigue: Secondary | ICD-10-CM | POA: Diagnosis not present

## 2021-11-15 DIAGNOSIS — G4733 Obstructive sleep apnea (adult) (pediatric): Secondary | ICD-10-CM | POA: Diagnosis not present

## 2021-11-15 DIAGNOSIS — I10 Essential (primary) hypertension: Secondary | ICD-10-CM | POA: Diagnosis not present

## 2021-11-15 DIAGNOSIS — M545 Low back pain, unspecified: Secondary | ICD-10-CM | POA: Diagnosis not present

## 2021-11-15 DIAGNOSIS — E6609 Other obesity due to excess calories: Secondary | ICD-10-CM | POA: Diagnosis not present

## 2021-11-15 DIAGNOSIS — F411 Generalized anxiety disorder: Secondary | ICD-10-CM | POA: Diagnosis not present

## 2021-11-15 DIAGNOSIS — E119 Type 2 diabetes mellitus without complications: Secondary | ICD-10-CM | POA: Diagnosis not present

## 2021-11-15 DIAGNOSIS — E7849 Other hyperlipidemia: Secondary | ICD-10-CM | POA: Diagnosis not present

## 2021-11-15 DIAGNOSIS — J438 Other emphysema: Secondary | ICD-10-CM | POA: Diagnosis not present

## 2022-03-01 DIAGNOSIS — Z23 Encounter for immunization: Secondary | ICD-10-CM | POA: Diagnosis not present

## 2022-03-01 DIAGNOSIS — B370 Candidal stomatitis: Secondary | ICD-10-CM | POA: Diagnosis not present

## 2022-03-01 DIAGNOSIS — E1142 Type 2 diabetes mellitus with diabetic polyneuropathy: Secondary | ICD-10-CM | POA: Diagnosis not present

## 2022-03-01 DIAGNOSIS — R42 Dizziness and giddiness: Secondary | ICD-10-CM | POA: Diagnosis not present

## 2022-03-01 DIAGNOSIS — I10 Essential (primary) hypertension: Secondary | ICD-10-CM | POA: Diagnosis not present

## 2022-03-01 DIAGNOSIS — Z6832 Body mass index (BMI) 32.0-32.9, adult: Secondary | ICD-10-CM | POA: Diagnosis not present

## 2022-03-16 DIAGNOSIS — E119 Type 2 diabetes mellitus without complications: Secondary | ICD-10-CM | POA: Diagnosis not present

## 2022-03-16 DIAGNOSIS — E7849 Other hyperlipidemia: Secondary | ICD-10-CM | POA: Diagnosis not present

## 2022-03-16 DIAGNOSIS — R5383 Other fatigue: Secondary | ICD-10-CM | POA: Diagnosis not present

## 2022-03-16 DIAGNOSIS — E876 Hypokalemia: Secondary | ICD-10-CM | POA: Diagnosis not present

## 2022-03-16 DIAGNOSIS — I10 Essential (primary) hypertension: Secondary | ICD-10-CM | POA: Diagnosis not present

## 2022-03-22 DIAGNOSIS — I10 Essential (primary) hypertension: Secondary | ICD-10-CM | POA: Diagnosis not present

## 2022-03-22 DIAGNOSIS — E7849 Other hyperlipidemia: Secondary | ICD-10-CM | POA: Diagnosis not present

## 2022-03-22 DIAGNOSIS — R5383 Other fatigue: Secondary | ICD-10-CM | POA: Diagnosis not present

## 2022-03-22 DIAGNOSIS — M545 Low back pain, unspecified: Secondary | ICD-10-CM | POA: Diagnosis not present

## 2022-03-22 DIAGNOSIS — E6609 Other obesity due to excess calories: Secondary | ICD-10-CM | POA: Diagnosis not present

## 2022-03-22 DIAGNOSIS — E119 Type 2 diabetes mellitus without complications: Secondary | ICD-10-CM | POA: Diagnosis not present

## 2022-03-22 DIAGNOSIS — F411 Generalized anxiety disorder: Secondary | ICD-10-CM | POA: Diagnosis not present

## 2022-03-22 DIAGNOSIS — J438 Other emphysema: Secondary | ICD-10-CM | POA: Diagnosis not present

## 2022-03-22 DIAGNOSIS — M069 Rheumatoid arthritis, unspecified: Secondary | ICD-10-CM | POA: Diagnosis not present

## 2022-03-22 DIAGNOSIS — G4733 Obstructive sleep apnea (adult) (pediatric): Secondary | ICD-10-CM | POA: Diagnosis not present

## 2022-06-09 DIAGNOSIS — Z0100 Encounter for examination of eyes and vision without abnormal findings: Secondary | ICD-10-CM | POA: Diagnosis not present

## 2022-07-18 DIAGNOSIS — I10 Essential (primary) hypertension: Secondary | ICD-10-CM | POA: Diagnosis not present

## 2022-07-18 DIAGNOSIS — E876 Hypokalemia: Secondary | ICD-10-CM | POA: Diagnosis not present

## 2022-07-18 DIAGNOSIS — E1142 Type 2 diabetes mellitus with diabetic polyneuropathy: Secondary | ICD-10-CM | POA: Diagnosis not present

## 2022-07-18 DIAGNOSIS — E7849 Other hyperlipidemia: Secondary | ICD-10-CM | POA: Diagnosis not present

## 2022-07-25 DIAGNOSIS — F411 Generalized anxiety disorder: Secondary | ICD-10-CM | POA: Diagnosis not present

## 2022-07-25 DIAGNOSIS — J438 Other emphysema: Secondary | ICD-10-CM | POA: Diagnosis not present

## 2022-07-25 DIAGNOSIS — R5383 Other fatigue: Secondary | ICD-10-CM | POA: Diagnosis not present

## 2022-07-25 DIAGNOSIS — I10 Essential (primary) hypertension: Secondary | ICD-10-CM | POA: Diagnosis not present

## 2022-07-25 DIAGNOSIS — M545 Low back pain, unspecified: Secondary | ICD-10-CM | POA: Diagnosis not present

## 2022-07-25 DIAGNOSIS — E6609 Other obesity due to excess calories: Secondary | ICD-10-CM | POA: Diagnosis not present

## 2022-07-25 DIAGNOSIS — E7849 Other hyperlipidemia: Secondary | ICD-10-CM | POA: Diagnosis not present

## 2022-07-25 DIAGNOSIS — E119 Type 2 diabetes mellitus without complications: Secondary | ICD-10-CM | POA: Diagnosis not present

## 2022-07-25 DIAGNOSIS — G4733 Obstructive sleep apnea (adult) (pediatric): Secondary | ICD-10-CM | POA: Diagnosis not present

## 2022-09-12 DIAGNOSIS — Z1231 Encounter for screening mammogram for malignant neoplasm of breast: Secondary | ICD-10-CM | POA: Diagnosis not present

## 2023-01-17 DIAGNOSIS — E7849 Other hyperlipidemia: Secondary | ICD-10-CM | POA: Diagnosis not present

## 2023-01-17 DIAGNOSIS — E1142 Type 2 diabetes mellitus with diabetic polyneuropathy: Secondary | ICD-10-CM | POA: Diagnosis not present

## 2023-01-17 DIAGNOSIS — E7801 Familial hypercholesterolemia: Secondary | ICD-10-CM | POA: Diagnosis not present

## 2023-01-17 DIAGNOSIS — J441 Chronic obstructive pulmonary disease with (acute) exacerbation: Secondary | ICD-10-CM | POA: Diagnosis not present

## 2023-01-24 DIAGNOSIS — R5383 Other fatigue: Secondary | ICD-10-CM | POA: Diagnosis not present

## 2023-01-24 DIAGNOSIS — E782 Mixed hyperlipidemia: Secondary | ICD-10-CM | POA: Diagnosis not present

## 2023-01-24 DIAGNOSIS — F411 Generalized anxiety disorder: Secondary | ICD-10-CM | POA: Diagnosis not present

## 2023-01-24 DIAGNOSIS — J438 Other emphysema: Secondary | ICD-10-CM | POA: Diagnosis not present

## 2023-01-24 DIAGNOSIS — I10 Essential (primary) hypertension: Secondary | ICD-10-CM | POA: Diagnosis not present

## 2023-01-24 DIAGNOSIS — E1142 Type 2 diabetes mellitus with diabetic polyneuropathy: Secondary | ICD-10-CM | POA: Diagnosis not present

## 2023-01-24 DIAGNOSIS — E6609 Other obesity due to excess calories: Secondary | ICD-10-CM | POA: Diagnosis not present

## 2023-01-24 DIAGNOSIS — S39012A Strain of muscle, fascia and tendon of lower back, initial encounter: Secondary | ICD-10-CM | POA: Diagnosis not present

## 2023-01-24 DIAGNOSIS — E119 Type 2 diabetes mellitus without complications: Secondary | ICD-10-CM | POA: Diagnosis not present

## 2023-01-24 DIAGNOSIS — E7849 Other hyperlipidemia: Secondary | ICD-10-CM | POA: Diagnosis not present

## 2023-01-24 DIAGNOSIS — M069 Rheumatoid arthritis, unspecified: Secondary | ICD-10-CM | POA: Diagnosis not present

## 2023-01-24 DIAGNOSIS — G4733 Obstructive sleep apnea (adult) (pediatric): Secondary | ICD-10-CM | POA: Diagnosis not present

## 2023-01-24 DIAGNOSIS — M545 Low back pain, unspecified: Secondary | ICD-10-CM | POA: Diagnosis not present

## 2023-01-24 DIAGNOSIS — Z0001 Encounter for general adult medical examination with abnormal findings: Secondary | ICD-10-CM | POA: Diagnosis not present

## 2023-03-13 DIAGNOSIS — R300 Dysuria: Secondary | ICD-10-CM | POA: Diagnosis not present

## 2023-07-19 DIAGNOSIS — R300 Dysuria: Secondary | ICD-10-CM | POA: Diagnosis not present

## 2023-07-19 DIAGNOSIS — R5383 Other fatigue: Secondary | ICD-10-CM | POA: Diagnosis not present

## 2023-07-19 DIAGNOSIS — Z6825 Body mass index (BMI) 25.0-25.9, adult: Secondary | ICD-10-CM | POA: Diagnosis not present

## 2023-07-20 DIAGNOSIS — R7309 Other abnormal glucose: Secondary | ICD-10-CM | POA: Diagnosis not present

## 2023-07-20 DIAGNOSIS — E119 Type 2 diabetes mellitus without complications: Secondary | ICD-10-CM | POA: Diagnosis not present

## 2023-07-20 DIAGNOSIS — R5383 Other fatigue: Secondary | ICD-10-CM | POA: Diagnosis not present

## 2023-07-20 DIAGNOSIS — E7849 Other hyperlipidemia: Secondary | ICD-10-CM | POA: Diagnosis not present

## 2023-07-20 DIAGNOSIS — E782 Mixed hyperlipidemia: Secondary | ICD-10-CM | POA: Diagnosis not present

## 2023-07-20 DIAGNOSIS — E876 Hypokalemia: Secondary | ICD-10-CM | POA: Diagnosis not present

## 2023-07-20 DIAGNOSIS — E7800 Pure hypercholesterolemia, unspecified: Secondary | ICD-10-CM | POA: Diagnosis not present

## 2023-08-02 DIAGNOSIS — E7849 Other hyperlipidemia: Secondary | ICD-10-CM | POA: Diagnosis not present

## 2023-08-02 DIAGNOSIS — R7309 Other abnormal glucose: Secondary | ICD-10-CM | POA: Diagnosis not present

## 2023-08-02 DIAGNOSIS — F418 Other specified anxiety disorders: Secondary | ICD-10-CM | POA: Diagnosis not present

## 2023-08-02 DIAGNOSIS — E782 Mixed hyperlipidemia: Secondary | ICD-10-CM | POA: Diagnosis not present

## 2023-08-02 DIAGNOSIS — Z6825 Body mass index (BMI) 25.0-25.9, adult: Secondary | ICD-10-CM | POA: Diagnosis not present

## 2023-08-02 DIAGNOSIS — Z87891 Personal history of nicotine dependence: Secondary | ICD-10-CM | POA: Diagnosis not present

## 2023-08-21 DIAGNOSIS — H9193 Unspecified hearing loss, bilateral: Secondary | ICD-10-CM | POA: Diagnosis not present

## 2023-08-21 DIAGNOSIS — Z6824 Body mass index (BMI) 24.0-24.9, adult: Secondary | ICD-10-CM | POA: Diagnosis not present

## 2023-09-19 DIAGNOSIS — Z1231 Encounter for screening mammogram for malignant neoplasm of breast: Secondary | ICD-10-CM | POA: Diagnosis not present

## 2023-11-29 DIAGNOSIS — H52223 Regular astigmatism, bilateral: Secondary | ICD-10-CM | POA: Diagnosis not present

## 2023-11-29 DIAGNOSIS — H524 Presbyopia: Secondary | ICD-10-CM | POA: Diagnosis not present

## 2023-11-29 DIAGNOSIS — H5203 Hypermetropia, bilateral: Secondary | ICD-10-CM | POA: Diagnosis not present

## 2024-02-13 DIAGNOSIS — E7849 Other hyperlipidemia: Secondary | ICD-10-CM | POA: Diagnosis not present

## 2024-02-13 DIAGNOSIS — E876 Hypokalemia: Secondary | ICD-10-CM | POA: Diagnosis not present

## 2024-02-13 DIAGNOSIS — E119 Type 2 diabetes mellitus without complications: Secondary | ICD-10-CM | POA: Diagnosis not present

## 2024-02-13 DIAGNOSIS — R5383 Other fatigue: Secondary | ICD-10-CM | POA: Diagnosis not present

## 2024-02-13 DIAGNOSIS — R7309 Other abnormal glucose: Secondary | ICD-10-CM | POA: Diagnosis not present

## 2024-02-20 DIAGNOSIS — F418 Other specified anxiety disorders: Secondary | ICD-10-CM | POA: Diagnosis not present

## 2024-02-20 DIAGNOSIS — G43919 Migraine, unspecified, intractable, without status migrainosus: Secondary | ICD-10-CM | POA: Diagnosis not present

## 2024-02-20 DIAGNOSIS — Z23 Encounter for immunization: Secondary | ICD-10-CM | POA: Diagnosis not present

## 2024-02-20 DIAGNOSIS — E782 Mixed hyperlipidemia: Secondary | ICD-10-CM | POA: Diagnosis not present

## 2024-02-20 DIAGNOSIS — J438 Other emphysema: Secondary | ICD-10-CM | POA: Diagnosis not present

## 2024-02-20 DIAGNOSIS — E7849 Other hyperlipidemia: Secondary | ICD-10-CM | POA: Diagnosis not present

## 2024-02-20 DIAGNOSIS — Z6824 Body mass index (BMI) 24.0-24.9, adult: Secondary | ICD-10-CM | POA: Diagnosis not present

## 2024-02-26 DIAGNOSIS — H02831 Dermatochalasis of right upper eyelid: Secondary | ICD-10-CM | POA: Diagnosis not present

## 2024-02-26 DIAGNOSIS — H01001 Unspecified blepharitis right upper eyelid: Secondary | ICD-10-CM | POA: Diagnosis not present

## 2024-02-26 DIAGNOSIS — H25813 Combined forms of age-related cataract, bilateral: Secondary | ICD-10-CM | POA: Diagnosis not present

## 2024-02-26 DIAGNOSIS — H02834 Dermatochalasis of left upper eyelid: Secondary | ICD-10-CM | POA: Diagnosis not present

## 2024-03-04 DIAGNOSIS — H25811 Combined forms of age-related cataract, right eye: Secondary | ICD-10-CM | POA: Diagnosis not present

## 2024-03-06 ENCOUNTER — Encounter (HOSPITAL_COMMUNITY)
Admission: RE | Admit: 2024-03-06 | Discharge: 2024-03-06 | Disposition: A | Discharge status: Home / Self Care | Source: Ambulatory Visit | Attending: Ophthalmology | Admitting: Ophthalmology

## 2024-03-06 NOTE — Pre-Procedure Instructions (Signed)
 Attempted pre-op phonecall. Left VM for her to call us  back.

## 2024-03-06 NOTE — H&P (Signed)
 Surgical History & Physical  Patient Name: Michaela Stafford  DOB: Aug 06, 1955  Surgery: Cataract extraction with intraocular lens implant phacoemulsification; Right Eye Surgeon: Lynwood Hermann MD Surgery Date: 03/11/2024 Pre-Op Date: 02/26/2024  HPI: A 60 Yr. old female patient 1. The patient is a new patient here today for a Cataract Evaluation. The patient complains of difficulty when reading fine print, books, newspaper, instructions etc., which began 1 year ago. Both eyes are affected. The episode is constant. The patient describes foggy and hazy symptoms affecting their eyes/vision which is worsening. This is negatively affecting the patient's quality of life and the patient is unable to function adequately in life with the current level of vision. HPI Completed by Dr. Lynwood Hermann  Medical History: Cataracts  Arthritis High Blood Pressure LDL  Review of Systems Cardiovascular High Blood Pressure Endocrine LDL Musculoskeletal arthritis All recorded systems are negative except as noted above.  Social Never Smoked  Medication Prednisolone-moxiflox-bromfen,  Metoprolol succinate, Omeprazole, Alprazolam, Lisinopril, Pravastatin, Rizatriptan, Sertraline  Sx/Procedures None  Drug Allergies  penicillin   History & Physical: Heent: cataracts NECK: supple without bruits LUNGS: lungs clear to auscultation CV: regular rate and rhythm Abdomen: soft and non-tender  Impression & Plan: Assessment: 1.  COMBINED FORMS AGE RELATED CATARACT; Both Eyes (H25.813) 2.  DERMATOCHALASIS, no surgery; Right Upper Lid, Left Upper Lid (H02.831, H02.834) 3.  BLEPHARITIS; Right Upper Lid, Right Lower Lid, Left Upper Lid, Left Lower Lid (H01.001, H01.002,H01.004,H01.005) 4.  Pinguecula; Both Eyes (H11.153) 5.  ASTIGMATISM, REGULAR; Both Eyes (H52.223)  Plan: 1.  Cataract accounts for the patient's decreased vision. This visual impairment is not correctable with a tolerable change in glasses or  contact lenses. Cataract surgery with an implantation of a new lens should significantly improve the visual and functional status of the patient. Recommend phacoemulsification with intraocular lens. Discussed all risks, benefits, alternatives, and potential complications. Discussed the procedures and recovery. The patient desires to have surgery. A-scan/Biometry ordered and will be performed for intraocular lens calculations. The surgery will be performed in order to improve vision for driving, reading, and for eye examinations. Recommend Dextenza for post-operative pain and inflammation. Educational materials provided: Cataract. History of corneal refractive Surgery: None History of Previous Ocular Surgery (PPV, other): None History of ocular trauma: None Use of Eye Pressure Lowering Drops: None No current contact lens use. Pupil Status: Dilates well - shugarcaine or Lidocaine+Omidira by protocol Right Eye. Refractive Goal: [place] Recommend LRI for astigmatism correction OU.  2.  Possibly visually significant - will address after cataract surgery.  3.  Blepharitis is present - recommend regular lid cleaning.  4.  Observe; Artificial tears as needed for irritation.  5.  Recommend LRI for astigmatism correction OU.

## 2024-03-07 ENCOUNTER — Other Ambulatory Visit: Payer: Self-pay

## 2024-03-07 ENCOUNTER — Encounter (HOSPITAL_COMMUNITY): Payer: Self-pay

## 2024-03-11 ENCOUNTER — Encounter (HOSPITAL_COMMUNITY): Payer: Self-pay | Admitting: Ophthalmology

## 2024-03-11 ENCOUNTER — Ambulatory Visit (HOSPITAL_COMMUNITY): Admitting: Anesthesiology

## 2024-03-11 ENCOUNTER — Encounter (HOSPITAL_COMMUNITY)
Admission: RE | Disposition: A | Discharge status: Home / Self Care | Payer: Self-pay | Source: Home / Self Care | Attending: Ophthalmology

## 2024-03-11 ENCOUNTER — Ambulatory Visit (HOSPITAL_COMMUNITY)
Admission: RE | Admit: 2024-03-11 | Discharge: 2024-03-11 | Disposition: A | Discharge status: Home / Self Care | Attending: Ophthalmology | Admitting: Ophthalmology

## 2024-03-11 DIAGNOSIS — H25811 Combined forms of age-related cataract, right eye: Secondary | ICD-10-CM | POA: Diagnosis not present

## 2024-03-11 DIAGNOSIS — F419 Anxiety disorder, unspecified: Secondary | ICD-10-CM | POA: Diagnosis not present

## 2024-03-11 DIAGNOSIS — J449 Chronic obstructive pulmonary disease, unspecified: Secondary | ICD-10-CM | POA: Insufficient documentation

## 2024-03-11 DIAGNOSIS — I10 Essential (primary) hypertension: Secondary | ICD-10-CM | POA: Insufficient documentation

## 2024-03-11 DIAGNOSIS — H5711 Ocular pain, right eye: Secondary | ICD-10-CM | POA: Diagnosis not present

## 2024-03-11 DIAGNOSIS — F32A Depression, unspecified: Secondary | ICD-10-CM | POA: Diagnosis not present

## 2024-03-11 DIAGNOSIS — Z87891 Personal history of nicotine dependence: Secondary | ICD-10-CM | POA: Diagnosis not present

## 2024-03-11 DIAGNOSIS — H52201 Unspecified astigmatism, right eye: Secondary | ICD-10-CM | POA: Diagnosis not present

## 2024-03-11 HISTORY — PX: INSERTION, STENT, DRUG-ELUTING, LACRIMAL CANALICULUS: SHX7453

## 2024-03-11 HISTORY — PX: CATARACT EXTRACTION W/PHACO: SHX586

## 2024-03-11 SURGERY — PHACOEMULSIFICATION, CATARACT, WITH IOL INSERTION
Anesthesia: Monitor Anesthesia Care | Site: Eye | Laterality: Right

## 2024-03-11 MED ORDER — SODIUM HYALURONATE 23MG/ML IO SOSY
PREFILLED_SYRINGE | INTRAOCULAR | Status: DC | PRN
  Administered 2024-03-11: .6 mL via INTRAOCULAR

## 2024-03-11 MED ORDER — TETRACAINE HCL 0.5 % OP SOLN
1.0000 [drp] | OPHTHALMIC | Status: AC | PRN
  Administered 2024-03-11 (×3): 1 [drp] via OPHTHALMIC

## 2024-03-11 MED ORDER — DEXAMETHASONE 0.4 MG OP INST
VAGINAL_INSERT | OPHTHALMIC | Status: DC | PRN
  Administered 2024-03-11: .4 mg via OPHTHALMIC

## 2024-03-11 MED ORDER — LIDOCAINE HCL 3.5 % OP GEL
1.0000 | Freq: Once | OPHTHALMIC | Status: AC
  Administered 2024-03-11: 1 via OPHTHALMIC

## 2024-03-11 MED ORDER — POVIDONE-IODINE 5 % OP SOLN
OPHTHALMIC | Status: DC | PRN
  Administered 2024-03-11: 1 via OPHTHALMIC

## 2024-03-11 MED ORDER — LIDOCAINE HCL (PF) 1 % IJ SOLN
INTRAMUSCULAR | Status: DC | PRN
  Administered 2024-03-11: 2 mL

## 2024-03-11 MED ORDER — LACTATED RINGERS IV SOLN: INTRAVENOUS | Status: DC

## 2024-03-11 MED ORDER — MIDAZOLAM HCL 2 MG/2ML IJ SOLN
INTRAMUSCULAR | Status: AC
  Filled 2024-03-11: qty 2

## 2024-03-11 MED ORDER — SODIUM HYALURONATE 10 MG/ML IO SOLUTION
PREFILLED_SYRINGE | INTRAOCULAR | Status: DC | PRN
  Administered 2024-03-11: .85 mL via INTRAOCULAR

## 2024-03-11 MED ORDER — PHENYLEPHRINE HCL 2.5 % OP SOLN
1.0000 [drp] | OPHTHALMIC | Status: AC | PRN
  Administered 2024-03-11 (×3): 1 [drp] via OPHTHALMIC

## 2024-03-11 MED ORDER — DEXAMETHASONE 0.4 MG OP INST
VAGINAL_INSERT | OPHTHALMIC | Status: AC
  Filled 2024-03-11: qty 1

## 2024-03-11 MED ORDER — PHENYLEPHRINE-KETOROLAC 1-0.3 % IO SOLN
INTRAOCULAR | Status: DC | PRN
  Administered 2024-03-11: 500 mL via OPHTHALMIC

## 2024-03-11 MED ORDER — BSS IO SOLN
INTRAOCULAR | Status: DC | PRN
  Administered 2024-03-11: 15 mL via INTRAOCULAR

## 2024-03-11 MED ORDER — STERILE WATER FOR IRRIGATION IR SOLN
Status: DC | PRN
  Administered 2024-03-11: 1

## 2024-03-11 MED ORDER — MOXIFLOXACIN HCL 5 MG/ML IO SOLN
INTRAOCULAR | Status: DC | PRN
  Administered 2024-03-11: .3 mL via INTRACAMERAL

## 2024-03-11 MED ORDER — TROPICAMIDE 1 % OP SOLN
1.0000 [drp] | OPHTHALMIC | Status: AC | PRN
  Administered 2024-03-11 (×3): 1 [drp] via OPHTHALMIC

## 2024-03-11 SURGICAL SUPPLY — 15 items
BLADE OPHTHALMIC KNIFE DISP (OPHTHALMIC) IMPLANT
CLOTH BEACON ORANGE TIMEOUT ST (SAFETY) ×2 IMPLANT
EYE SHIELD UNIVERSAL CLEAR (GAUZE/BANDAGES/DRESSINGS) IMPLANT
FEE CATARACT SUITE SIGHTPATH (MISCELLANEOUS) ×2 IMPLANT
GLOVE BIOGEL PI IND STRL 7.0 (GLOVE) ×4 IMPLANT
LENS IOL TECNIS EYHANCE 22.5 (Intraocular Lens) IMPLANT
MANI OPHTHALMIC KNIFE IMPLANT
MARKER SKIN DUAL TIP RULER LAB (MISCELLANEOUS) IMPLANT
NDL HYPO 18GX1.5 BLUNT FILL (NEEDLE) ×2 IMPLANT
NEEDLE HYPO 18GX1.5 BLUNT FILL (NEEDLE) ×2 IMPLANT
PAD ARMBOARD POSITIONER FOAM (MISCELLANEOUS) ×2 IMPLANT
SIGHTPATH CAT PROC W REG LENS (Ophthalmic Related) IMPLANT
SYR TB 1ML LL NO SAFETY (SYRINGE) ×2 IMPLANT
TAPE SURG TRANSPORE 1 IN (GAUZE/BANDAGES/DRESSINGS) IMPLANT
WATER STERILE IRR 250ML POUR (IV SOLUTION) ×2 IMPLANT

## 2024-03-11 NOTE — Interval H&P Note (Signed)
 History and Physical Interval Note:  03/11/2024 8:26 AM  Michaela Stafford  has presented today for surgery, with the diagnosis of combined forms age related cataract, right eye.  The various methods of treatment have been discussed with the patient and family. After consideration of risks, benefits and other options for treatment, the patient has consented to  Procedure(s) with comments: PHACOEMULSIFICATION, CATARACT, WITH IOL INSERTION (Right) - with Limbal Relaxing incision : will need 500 micron LRI blade INSERTION, STENT, DRUG-ELUTING, LACRIMAL CANALICULUS (Right) as a surgical intervention.  The patient's history has been reviewed, patient examined, no change in status, stable for surgery.  I have reviewed the patient's chart and labs.  Questions were answered to the patient's satisfaction.     HARRIE AGENT

## 2024-03-11 NOTE — Discharge Instructions (Addendum)
 Please discharge patient when stable, will follow up today with Dr. June Leap at the [facility] office immediately following discharge.  Leave shield in place until visit.  All paperwork with discharge instructions will be given at the office.  [facility] Address:  [address]  [place], [place] [address]

## 2024-03-11 NOTE — Anesthesia Postprocedure Evaluation (Signed)
 Anesthesia Post Note  Patient: Michaela Stafford  Procedure(s) Performed: PHACOEMULSIFICATION, CATARACT, WITH IOL INSERTION AND LIMBAL RELAXING INCISION (Right: Eye) INSERTION, STENT, DRUG-ELUTING, LACRIMAL CANALICULUS (Right)  Patient location during evaluation: Phase II Anesthesia Type: MAC Level of consciousness: awake Pain management: pain level controlled Vital Signs Assessment: post-procedure vital signs reviewed and stable Respiratory status: spontaneous breathing and respiratory function stable Cardiovascular status: blood pressure returned to baseline and stable Postop Assessment: no headache and no apparent nausea or vomiting Anesthetic complications: no Comments: Late entry   No notable events documented.   Last Vitals:  Vitals:   03/11/24 0702 03/11/24 0854  BP: (!) 149/60 (!) 145/61  Pulse: (!) 52 (!) 54  Resp: 16 15  Temp: 36.6 C 36.6 C  SpO2: 100% 100%    Last Pain:  Vitals:   03/11/24 0854  TempSrc: Oral  PainSc: 0-No pain                 Yvonna JINNY Bosworth

## 2024-03-11 NOTE — Transfer of Care (Signed)
 Immediate Anesthesia Transfer of Care Note  Patient: Michaela Stafford  Procedure(s) Performed: PHACOEMULSIFICATION, CATARACT, WITH IOL INSERTION AND LIMBAL RELAXING INCISION (Right: Eye) INSERTION, STENT, DRUG-ELUTING, LACRIMAL CANALICULUS (Right)  Patient Location: PACU  Anesthesia Type:MAC  Level of Consciousness: awake, alert , and oriented  Airway & Oxygen Therapy: Patient Spontanous Breathing  Post-op Assessment: Report given to RN and Post -op Vital signs reviewed and stable  Post vital signs: Reviewed and stable  Last Vitals:  Vitals Value Taken Time  BP    Temp    Pulse    Resp    SpO2      Last Pain:  Vitals:   03/11/24 0702  TempSrc: Oral  PainSc: 0-No pain      Patients Stated Pain Goal: 5 (03/11/24 0702)  Complications: No notable events documented.

## 2024-03-11 NOTE — Anesthesia Preprocedure Evaluation (Signed)
 Anesthesia Evaluation  Patient identified by MRN, date of birth, ID band Patient awake    Reviewed: Allergy & Precautions, H&P , NPO status , Patient's Chart, lab work & pertinent test results, reviewed documented beta blocker date and time   Airway Mallampati: II  TM Distance: >3 FB Neck ROM: full    Dental no notable dental hx.    Pulmonary COPD, former smoker   Pulmonary exam normal breath sounds clear to auscultation       Cardiovascular Exercise Tolerance: Good hypertension,  Rhythm:regular Rate:Normal     Neuro/Psych  Headaches PSYCHIATRIC DISORDERS Anxiety Depression       GI/Hepatic negative GI ROS, Neg liver ROS,,,  Endo/Other  negative endocrine ROS    Renal/GU negative Renal ROS  negative genitourinary   Musculoskeletal   Abdominal   Peds  Hematology negative hematology ROS (+)   Anesthesia Other Findings   Reproductive/Obstetrics negative OB ROS                              Anesthesia Physical Anesthesia Plan  ASA: 2  Anesthesia Plan: MAC   Post-op Pain Management:    Induction:   PONV Risk Score and Plan:   Airway Management Planned:   Additional Equipment:   Intra-op Plan:   Post-operative Plan:   Informed Consent: I have reviewed the patients History and Physical, chart, labs and discussed the procedure including the risks, benefits and alternatives for the proposed anesthesia with the patient or authorized representative who has indicated his/her understanding and acceptance.     Dental Advisory Given  Plan Discussed with: CRNA  Anesthesia Plan Comments:         Anesthesia Quick Evaluation

## 2024-03-11 NOTE — Op Note (Signed)
 Date of procedure: 03/11/24  Pre-operative diagnosis: Visually significant age-related cataract, Right Eye; Visually Significant Astigmatism, Right Eye (H25.?1)  Post-operative diagnosis:  Visually significant age-related cataract, Right Eye Visually Significant Astigmatism, Right Eye Pain and inflammation after cataract surgery, Right eye  Procedure:  Removal of cataract via phacoemulsification and insertion of intra-ocular lens [employer] DIB00 +22.5 into the capsular bag of the Right Eye Limbal Relaxing Incision, Right Eye Placement of Dextenza insert, Right lower eyelid  Attending surgeon: Lynwood LABOR. Crystle Carelli, MD, MA  Anesthesia: MAC, Topical Akten  Complications: None  Estimated Blood Loss: <40mL (minimal)  Specimens: None  Implants: As above  Indications:  Visually significant age-related cataract, Right Eye; Visually Significant Astigmatism, Right Eye  Procedure:  The patient was seen and identified in the pre-operative area. The operative eye was identified and dilated.  The operative eye was marked.  Pre-operative toric markers were used to mark the eye at 0 and 180 degrees. Topical anesthesia was administered to the operative eye.     The patient was then to the operative suite and placed in the supine position.  A timeout was performed confirming the patient, procedure to be performed, and all other relevant information.   The patient's face was prepped and draped in the usual fashion for intra-ocular surgery.  A lid speculum was placed into the operative eye and the surgical microscope moved into place and focused.  A superotemporal paracentesis was created using a 20 gauge paracentesis blade. Omidria was injected into the anterior chamber. Shugarcaine was injected into the anterior chamber.  Viscoelastic was injected into the anterior chamber.  A temporal clear-corneal main wound incision was created using a 2.64mm microkeratome.  A continuous curvilinear  capsulorrhexis was initiated using an irrigating cystitome and completed using capsulorrhexis forceps.  Hydrodissection and hydrodeliniation were performed.  Viscoelastic was injected into the anterior chamber.  A phacoemulsification handpiece and a chopper as a second instrument were used to remove the nucleus and epinucleus. The irrigation/aspiration handpiece was used to remove any remaining cortical material.   The capsular bag was reinflated with viscoelastic, checked, and found to be intact.  The eye was marked at 347 and 15 degrees.  The intraocular lens was inserted into the capsular bag and dialed into place. A 28 degree limbal relaxing incision was created using a 500 micron blade from 347 to 15 degrees  The irrigation/aspiration handpiece was used to remove any remaining viscoelastic.  The clear corneal wound and paracentesis wounds were then hydrated and checked with Weck-Cels to be watertight. 0.1mL of moxifloxacin was injected into the anterior chamber. The lid-speculum was removed.    The lower punctum was dilated and filled with Provisc. A Dextenza implant was placed in the lower canaliculus without complication.  The drape was removed. The patient's face was cleaned with a wet and dry 4x4.   A clear shield was taped over the eye. The patient was taken to the post-operative care unit in good condition, having tolerated the procedure well.  Post-Op Instructions: The patient will follow up at [facility] for a same day post-operative evaluation and will receive all other orders and instructions.

## 2024-03-18 ENCOUNTER — Encounter (HOSPITAL_COMMUNITY): Payer: Self-pay | Admitting: Ophthalmology

## 2024-03-18 DIAGNOSIS — H25812 Combined forms of age-related cataract, left eye: Secondary | ICD-10-CM | POA: Diagnosis not present

## 2024-03-20 ENCOUNTER — Encounter (HOSPITAL_COMMUNITY)
Admission: RE | Admit: 2024-03-20 | Discharge: 2024-03-20 | Disposition: A | Discharge status: Home / Self Care | Source: Ambulatory Visit | Attending: Ophthalmology | Admitting: Ophthalmology

## 2024-03-20 NOTE — H&P (Signed)
 Surgical History & Physical  Patient Name: Michaela Stafford  DOB: 03/14/1956  Surgery: Cataract extraction with intraocular lens implant phacoemulsification; Left Eye Surgeon: Lynwood Hermann MD Surgery Date: 03/25/2024 Pre-Op Date: 03/18/2024  HPI: A 32 Yr. old female patient Pt returns for 1 week PCIOL PO OD/preop OS. 1. The patient is returning after cataract surgery. The right eye is affected. Status post cataract surgery, which began 1 weeks ago: Since the last visit, the affected area feels improvement and is doing well. The patient's vision is improved. The patient experiences no diplopia. The condition's severity is constant. The complaint is associated with irritation. Patient is following medication instructions. The patient experiences no eye pain and no flashes, floater, shadow, curtain or veil. OD FBS sometimes, OS scratchy- not using Dry Eye drops. C/O difficulty OS: glare at night, hard to see distance for signs/captions on TV, recognizing ppl, poor night [place], hazy/blurry [place]. This is negatively affecting the patient's quality of life and the patient is unable to function adequately in life with the current level of vision. Pt would like PCIOL sx OS HPI Completed by Dr. Lynwood Hermann  Medical History: Cataracts  Arthritis High Blood Pressure LDL  Review of Systems Cardiovascular High Blood Pressure Endocrine LDL Musculoskeletal arthritis All recorded systems are negative except as noted above.  Social Never Smoked  Medication Prednisolone-moxiflox-bromfen,  Metoprolol succinate, Omeprazole, Alprazolam, Lisinopril, Pravastatin, Rizatriptan, Sertraline  Sx/Procedures Phaco c IOL OD - LRI - Dextenza  Drug Allergies  penicillins   History & Physical: Heent: cataract NECK: supple without bruits LUNGS: lungs clear to auscultation CV: regular rate and rhythm Abdomen: soft and non-tender  Impression & Plan: Assessment: 1.  CATARACT EXTRACTION STATUS; Right Eye (Z98.41) 2.   COMBINED FORMS AGE RELATED CATARACT; Left Eye (H25.812)  Plan: 1.  1 week after cataract surgery. Doing well with improved vision and normal eye pressure. Call with any problems or concerns. Continue Pred-Mox-Brom Combo drop 2x/day for 1 more week.  2.  Cataract accounts for the patient's decreased vision. This visual impairment is not correctable with a tolerable change in glasses or contact lenses. Cataract surgery with an implantation of a new lens should significantly improve the visual and functional status of the patient. Recommend phacoemulsification with intraocular lens. Discussed all risks, benefits, alternatives, and potential complications. Discussed the procedures and recovery. The patient desires to have surgery. A-scan/Biometry ordered and will be performed for intraocular lens calculations. The surgery will be performed in order to improve vision for driving, reading, and for eye examinations. Recommend Dextenza for post-operative pain and inflammation. Educational materials provided: Cataract. History of corneal refractive Surgery: None History of Previous Ocular Surgery (PPV, other): None History of ocular trauma: None Use of Eye Pressure Lowering Drops: None No current contact lens use. Pupil Status: Dilates well - shugarcaine or Lidocaine+Omidira by protocol Left eye. Refractive Goal: [place] Recommend LRI for astigmatism correction OU.

## 2024-03-25 ENCOUNTER — Ambulatory Visit (HOSPITAL_COMMUNITY): Admitting: Certified Registered"

## 2024-03-25 ENCOUNTER — Ambulatory Visit (HOSPITAL_COMMUNITY)
Admission: RE | Admit: 2024-03-25 | Discharge: 2024-03-25 | Disposition: A | Discharge status: Home / Self Care | Attending: Ophthalmology | Admitting: Ophthalmology

## 2024-03-25 ENCOUNTER — Encounter (HOSPITAL_COMMUNITY): Payer: Self-pay | Admitting: Ophthalmology

## 2024-03-25 ENCOUNTER — Other Ambulatory Visit: Payer: Self-pay

## 2024-03-25 ENCOUNTER — Encounter (HOSPITAL_COMMUNITY)
Admission: RE | Disposition: A | Discharge status: Home / Self Care | Payer: Self-pay | Source: Home / Self Care | Attending: Ophthalmology

## 2024-03-25 DIAGNOSIS — H52202 Unspecified astigmatism, left eye: Secondary | ICD-10-CM

## 2024-03-25 DIAGNOSIS — H25812 Combined forms of age-related cataract, left eye: Secondary | ICD-10-CM | POA: Insufficient documentation

## 2024-03-25 DIAGNOSIS — Z87891 Personal history of nicotine dependence: Secondary | ICD-10-CM | POA: Diagnosis not present

## 2024-03-25 DIAGNOSIS — H269 Unspecified cataract: Secondary | ICD-10-CM | POA: Diagnosis not present

## 2024-03-25 DIAGNOSIS — J449 Chronic obstructive pulmonary disease, unspecified: Secondary | ICD-10-CM

## 2024-03-25 DIAGNOSIS — I10 Essential (primary) hypertension: Secondary | ICD-10-CM | POA: Diagnosis not present

## 2024-03-25 DIAGNOSIS — Z9841 Cataract extraction status, right eye: Secondary | ICD-10-CM | POA: Insufficient documentation

## 2024-03-25 DIAGNOSIS — H52222 Regular astigmatism, left eye: Secondary | ICD-10-CM | POA: Diagnosis present

## 2024-03-25 DIAGNOSIS — H5712 Ocular pain, left eye: Secondary | ICD-10-CM | POA: Diagnosis not present

## 2024-03-25 HISTORY — PX: CATARACT EXTRACTION W/PHACO: SHX586

## 2024-03-25 HISTORY — PX: INSERTION, STENT, DRUG-ELUTING, LACRIMAL CANALICULUS: SHX7453

## 2024-03-25 SURGERY — PHACOEMULSIFICATION, CATARACT, WITH IOL INSERTION
Anesthesia: Monitor Anesthesia Care | Site: Eye | Laterality: Left

## 2024-03-25 MED ORDER — TETRACAINE HCL 0.5 % OP SOLN
1.0000 [drp] | OPHTHALMIC | Status: AC | PRN
  Administered 2024-03-25 (×3): 1 [drp] via OPHTHALMIC

## 2024-03-25 MED ORDER — DEXAMETHASONE 0.4 MG OP INST
VAGINAL_INSERT | OPHTHALMIC | Status: AC
  Filled 2024-03-25: qty 1

## 2024-03-25 MED ORDER — LIDOCAINE HCL 3.5 % OP GEL
1.0000 | Freq: Once | OPHTHALMIC | Status: AC
  Administered 2024-03-25: 1 via OPHTHALMIC

## 2024-03-25 MED ORDER — SODIUM HYALURONATE 23MG/ML IO SOSY
PREFILLED_SYRINGE | INTRAOCULAR | Status: DC | PRN
  Administered 2024-03-25: .6 mL via INTRAOCULAR

## 2024-03-25 MED ORDER — PHENYLEPHRINE-KETOROLAC 1-0.3 % IO SOLN
INTRAOCULAR | Status: DC | PRN
  Administered 2024-03-25: 500 mL via OPHTHALMIC

## 2024-03-25 MED ORDER — PHENYLEPHRINE HCL 2.5 % OP SOLN
1.0000 [drp] | OPHTHALMIC | Status: AC | PRN
  Administered 2024-03-25 (×3): 1 [drp] via OPHTHALMIC

## 2024-03-25 MED ORDER — STERILE WATER FOR IRRIGATION IR SOLN
Status: DC | PRN
  Administered 2024-03-25: 250 mL

## 2024-03-25 MED ORDER — TROPICAMIDE 1 % OP SOLN
1.0000 [drp] | OPHTHALMIC | Status: AC | PRN
  Administered 2024-03-25 (×3): 1 [drp] via OPHTHALMIC

## 2024-03-25 MED ORDER — POVIDONE-IODINE 5 % OP SOLN
OPHTHALMIC | Status: DC | PRN
  Administered 2024-03-25: 1 via OPHTHALMIC

## 2024-03-25 MED ORDER — SODIUM HYALURONATE 10 MG/ML IO SOLUTION
PREFILLED_SYRINGE | INTRAOCULAR | Status: DC | PRN
  Administered 2024-03-25: .85 mL via INTRAOCULAR

## 2024-03-25 MED ORDER — LIDOCAINE HCL (PF) 1 % IJ SOLN
INTRAMUSCULAR | Status: DC | PRN
Start: 2024-03-25 — End: 2024-03-25
  Administered 2024-03-25: 2 mL

## 2024-03-25 MED ORDER — MOXIFLOXACIN HCL 5 MG/ML IO SOLN
INTRAOCULAR | Status: DC | PRN
  Administered 2024-03-25: .2 mL via OPHTHALMIC

## 2024-03-25 MED ORDER — DEXAMETHASONE 0.4 MG OP INST
VAGINAL_INSERT | OPHTHALMIC | Status: DC | PRN
  Administered 2024-03-25: .4 mg via OPHTHALMIC

## 2024-03-25 MED ORDER — BSS IO SOLN
INTRAOCULAR | Status: DC | PRN
  Administered 2024-03-25: 15 mL via INTRAOCULAR

## 2024-03-25 SURGICAL SUPPLY — 13 items
BLADE OPHTHALMIC KNIFE DISP (OPHTHALMIC) IMPLANT
CLOTH BEACON ORANGE TIMEOUT ST (SAFETY) ×1 IMPLANT
EYE SHIELD UNIVERSAL CLEAR (GAUZE/BANDAGES/DRESSINGS) IMPLANT
FEE CATARACT SUITE SIGHTPATH (MISCELLANEOUS) ×1 IMPLANT
GLOVE BIOGEL PI IND STRL 7.0 (GLOVE) ×2 IMPLANT
LENS IOL TECNIS EYHANCE 22.0 (Intraocular Lens) IMPLANT
NDL HYPO 18GX1.5 BLUNT FILL (NEEDLE) ×1 IMPLANT
NEEDLE HYPO 18GX1.5 BLUNT FILL (NEEDLE) ×1 IMPLANT
PAD ARMBOARD POSITIONER FOAM (MISCELLANEOUS) ×1 IMPLANT
SYR TB 1ML LL NO SAFETY (SYRINGE) ×1 IMPLANT
TAPE SURG TRANSPORE 1 IN (GAUZE/BANDAGES/DRESSINGS) IMPLANT
TIP IRRIGATON/ASPIRATION (MISCELLANEOUS) IMPLANT
WATER STERILE IRR 250ML POUR (IV SOLUTION) ×1 IMPLANT

## 2024-03-25 NOTE — Discharge Instructions (Addendum)
 Please discharge patient when stable, will follow up today with Dr. June Leap at the [facility] office immediately following discharge.  Leave shield in place until visit.  All paperwork with discharge instructions will be given at the office.  [facility] Address:  [address]  [place], [place] [address]

## 2024-03-25 NOTE — Anesthesia Postprocedure Evaluation (Signed)
 Anesthesia Post Note  Patient: Michaela Stafford  Procedure(s) Performed: PHACOEMULSIFICATION, CATARACT, WITH IOL INSERTION (Left: Eye) INSERTION, STENT, DRUG-ELUTING, LACRIMAL CANALICULUS (Left: Eye) INCISION, LIMBUS, CORNEA, RELAXING (Left: Eye)  Patient location during evaluation: PACU Anesthesia Type: MAC Level of consciousness: awake and alert Pain management: pain level controlled Vital Signs Assessment: post-procedure vital signs reviewed and stable Respiratory status: spontaneous breathing, nonlabored ventilation, respiratory function stable and patient connected to nasal cannula oxygen Cardiovascular status: stable and blood pressure returned to baseline Postop Assessment: no apparent nausea or vomiting Anesthetic complications: no   No notable events documented.   Last Vitals:  Vitals:   03/25/24 1201 03/25/24 1252  BP: (!) 126/58 (!) 134/58  Pulse:  (!) 58  Resp:  16  Temp:  36.6 C  SpO2:  100%    Last Pain:  Vitals:   03/25/24 1252  TempSrc: Oral  PainSc: 0-No pain                 Andrea Limes

## 2024-03-25 NOTE — Anesthesia Preprocedure Evaluation (Signed)
 Anesthesia Evaluation  Patient identified by MRN, date of birth, ID band Patient awake    Reviewed: Allergy & Precautions, H&P , NPO status , Patient's Chart, lab work & pertinent test results  Airway Mallampati: II  TM Distance: >3 FB Neck ROM: Full    Dental no notable dental hx.    Pulmonary COPD, former smoker   Pulmonary exam normal breath sounds clear to auscultation       Cardiovascular hypertension, Normal cardiovascular exam Rhythm:Regular Rate:Normal     Neuro/Psych  Headaches PSYCHIATRIC DISORDERS Anxiety Depression       GI/Hepatic negative GI ROS, Neg liver ROS,,,  Endo/Other  negative endocrine ROS    Renal/GU negative Renal ROS  negative genitourinary   Musculoskeletal negative musculoskeletal ROS (+)    Abdominal   Peds negative pediatric ROS (+)  Hematology negative hematology ROS (+)   Anesthesia Other Findings   Reproductive/Obstetrics negative OB ROS                              Anesthesia Physical Anesthesia Plan  ASA: 3  Anesthesia Plan: MAC   Post-op Pain Management:    Induction:   PONV Risk Score and Plan:   Airway Management Planned: Nasal Cannula  Additional Equipment:   Intra-op Plan:   Post-operative Plan:   Informed Consent: I have reviewed the patients History and Physical, chart, labs and discussed the procedure including the risks, benefits and alternatives for the proposed anesthesia with the patient or authorized representative who has indicated his/her understanding and acceptance.     Dental advisory given  Plan Discussed with: CRNA  Anesthesia Plan Comments:         Anesthesia Quick Evaluation

## 2024-03-25 NOTE — Op Note (Signed)
 Date of procedure: 03/25/24  Pre-operative diagnosis: Visually significant age-related combined-form cataract, Left Eye; Visually Significant Astigmatism, Left Eye (H25.812)  Post-operative diagnosis:  Visually significant age-related cataract, Left Eye Visually Significant Astigmatism, Left Eye Pain and inflammation after cataract surgery, Left eye  Procedure:  Removal of cataract via phacoemulsification and insertion of intra-ocular lens Vicci and Johnson DIB00+22.0D into the capsular bag of the Left Eye Creation of Limbal Relaxing Incisions, Left Eye Placement of Dextenza insert, Left lower eyelid  Attending surgeon: Lynwood LABOR. Seona Clemenson, MD, MA  Anesthesia: MAC, Topical [place]  Complications: None  Estimated Blood Loss: <27mL (minimal)  Specimens: None  Implants: As above  Indications:  Visually significant age-related cataract, Left Eye; Visually Significant Astigmatism, Left Eye  Procedure:  The patient was seen and identified in the pre-operative area. The operative eye was identified and dilated.  The operative eye was marked.  Pre-operative toric markers were used to mark the eye at 270 degrees. Topical anesthesia was administered to the operative eye.     The patient was then to the operative suite and placed in the supine position.  A timeout was performed confirming the patient, procedure to be performed, and all other relevant information.   The patient's face was prepped and draped in the usual fashion for intra-ocular surgery.  A lid speculum was placed into the operative eye and the surgical microscope moved into place and focused.  A superotemporal paracentesis was created using a 20 gauge paracentesis blade. Omidria was injected into the anterior chamber. Shugarcaine was injected into the anterior chamber.  Viscoelastic was injected into the anterior chamber.  A temporal clear-corneal main wound incision was created using a 2.3mm microkeratome.  A continuous curvilinear  capsulorrhexis was initiated using an irrigating cystitome and completed using capsulorrhexis forceps.  Hydrodissection and hydrodeliniation were performed.  Viscoelastic was injected into the anterior chamber.  A phacoemulsification handpiece and a chopper as a second instrument were used to remove the nucleus and epinucleus. The irrigation/aspiration handpiece was used to remove any remaining cortical material.   The capsular bag was reinflated with viscoelastic, checked, and found to be intact.  The eye was marked per the pre-op Limbal Relaxing Incision parameters.  The intraocular lens was inserted into the capsular bag and dialed into place. A 500 micron guarded blade was used to create the limbal relaxing incisions at the marked zones from 40-62 degrees and from 220-242 degrees.  The irrigation/aspiration handpiece was used to remove any remaining viscoelastic.  The clear corneal wound and paracentesis wounds were then hydrated and checked with Weck-Cels to be watertight. 0.1mL of moxifloxacin was injected into the anterior chamber. The lid-speculum was removed.    The lower punctum was dilated and filled with Provisc. A Dextenza implant was placed in the lower canaliculus without complication.  The drape was removed. The patient's face was cleaned with a wet and dry 4x4.   A clear shield was taped over the eye. The patient was taken to the post-operative care unit in good condition, having tolerated the procedure well.  Post-Op Instructions: The patient will follow up at [facility] for a same day post-operative evaluation and will receive all other orders and instructions.

## 2024-03-25 NOTE — Transfer of Care (Signed)
 Immediate Anesthesia Transfer of Care Note  Patient: Michaela Stafford  Procedure(s) Performed: PHACOEMULSIFICATION, CATARACT, WITH IOL INSERTION (Left: Eye) INSERTION, STENT, DRUG-ELUTING, LACRIMAL CANALICULUS (Left: Eye) INCISION, LIMBUS, CORNEA, RELAXING (Left: Eye)  Patient Location: Short Stay  Anesthesia Type:MAC  Level of Consciousness: awake, alert , oriented, and patient cooperative  Airway & Oxygen Therapy: Patient Spontanous Breathing  Post-op Assessment: Report given to RN and Post -op Vital signs reviewed and stable  Post vital signs: Reviewed and stable  Last Vitals:  Vitals Value Taken Time  BP 124/68   Temp 97.7   Pulse 68   Resp 16   SpO2 98     Last Pain:  Vitals:   03/25/24 1201  TempSrc:   PainSc: 0-No pain      Patients Stated Pain Goal: 5 (03/25/24 1201)  Complications: No notable events documented.

## 2024-03-25 NOTE — Interval H&P Note (Signed)
 History and Physical Interval Note:  03/25/2024 12:27 PM  Michaela Stafford  has presented today for surgery, with the diagnosis of combined forms age related cataract, left eye astigmatism, left eye.  The various methods of treatment have been discussed with the patient and family. After consideration of risks, benefits and other options for treatment, the patient has consented to  Procedure(s) with comments: PHACOEMULSIFICATION, CATARACT, WITH IOL INSERTION (Left) - please add LRI INSERTION, STENT, DRUG-ELUTING, LACRIMAL CANALICULUS (Left) INCISION, LIMBUS, CORNEA, RELAXING (Left) as a surgical intervention.  The patient's history has been reviewed, patient examined, no change in status, stable for surgery.  I have reviewed the patient's chart and labs.  Questions were answered to the patient's satisfaction.     HARRIE AGENT

## 2024-03-30 ENCOUNTER — Encounter (HOSPITAL_COMMUNITY): Payer: Self-pay | Admitting: Ophthalmology

## 2024-04-22 DIAGNOSIS — R300 Dysuria: Secondary | ICD-10-CM | POA: Diagnosis not present
